# Patient Record
Sex: Female | Born: 1980 | Race: Black or African American | Hispanic: No | Marital: Single | Smoking: Never smoker
Health system: Southern US, Community
[De-identification: ages and names within clinical notes are randomized; demographics above are authoritative.]

## PROBLEM LIST (undated history)

## (undated) DIAGNOSIS — Z8619 Personal history of other infectious and parasitic diseases: Secondary | ICD-10-CM

## (undated) DIAGNOSIS — Z862 Personal history of diseases of the blood and blood-forming organs and certain disorders involving the immune mechanism: Secondary | ICD-10-CM

## (undated) DIAGNOSIS — D649 Anemia, unspecified: Secondary | ICD-10-CM

## (undated) DIAGNOSIS — N938 Other specified abnormal uterine and vaginal bleeding: Secondary | ICD-10-CM

## (undated) DIAGNOSIS — Z973 Presence of spectacles and contact lenses: Secondary | ICD-10-CM

## (undated) DIAGNOSIS — N840 Polyp of corpus uteri: Secondary | ICD-10-CM

## (undated) DIAGNOSIS — D259 Leiomyoma of uterus, unspecified: Secondary | ICD-10-CM

## (undated) DIAGNOSIS — R7303 Prediabetes: Secondary | ICD-10-CM

## (undated) DIAGNOSIS — A749 Chlamydial infection, unspecified: Secondary | ICD-10-CM

## (undated) DIAGNOSIS — D219 Benign neoplasm of connective and other soft tissue, unspecified: Secondary | ICD-10-CM

## (undated) HISTORY — PX: DILATION AND CURETTAGE OF UTERUS: SHX78

---
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
## 2001-02-17 ENCOUNTER — Emergency Department (HOSPITAL_COMMUNITY): Admission: EM | Admit: 2001-02-17 | Discharge: 2001-02-17 | Payer: Self-pay

## 2001-07-17 ENCOUNTER — Other Ambulatory Visit: Admission: RE | Admit: 2001-07-17 | Discharge: 2001-07-17 | Payer: Self-pay | Admitting: Obstetrics and Gynecology

## 2005-06-27 ENCOUNTER — Emergency Department (HOSPITAL_COMMUNITY): Admission: EM | Admit: 2005-06-27 | Discharge: 2005-06-27 | Payer: Self-pay | Admitting: Family Medicine

## 2005-06-28 ENCOUNTER — Ambulatory Visit (HOSPITAL_COMMUNITY): Admission: RE | Admit: 2005-06-28 | Discharge: 2005-06-28 | Payer: Self-pay | Admitting: Family Medicine

## 2005-11-21 ENCOUNTER — Other Ambulatory Visit: Admission: RE | Admit: 2005-11-21 | Discharge: 2005-11-21 | Payer: Self-pay | Admitting: Gynecology

## 2005-12-31 HISTORY — PX: DILATION AND CURETTAGE OF UTERUS: SHX78

## 2006-07-18 ENCOUNTER — Ambulatory Visit (HOSPITAL_BASED_OUTPATIENT_CLINIC_OR_DEPARTMENT_OTHER): Admission: RE | Admit: 2006-07-18 | Discharge: 2006-07-18 | Payer: Self-pay | Admitting: Gynecology

## 2006-07-18 ENCOUNTER — Encounter (INDEPENDENT_AMBULATORY_CARE_PROVIDER_SITE_OTHER): Payer: Self-pay | Admitting: *Deleted

## 2006-11-18 ENCOUNTER — Other Ambulatory Visit: Admission: RE | Admit: 2006-11-18 | Discharge: 2006-11-18 | Payer: Self-pay | Admitting: Gynecology

## 2007-07-24 ENCOUNTER — Other Ambulatory Visit: Admission: RE | Admit: 2007-07-24 | Discharge: 2007-07-24 | Payer: Self-pay | Admitting: Gynecology

## 2009-04-09 ENCOUNTER — Emergency Department (HOSPITAL_COMMUNITY): Admission: EM | Admit: 2009-04-09 | Discharge: 2009-04-09 | Payer: Self-pay | Admitting: Emergency Medicine

## 2010-11-26 ENCOUNTER — Emergency Department (HOSPITAL_COMMUNITY)
Admission: EM | Admit: 2010-11-26 | Discharge: 2010-11-26 | Payer: Self-pay | Source: Home / Self Care | Admitting: Family Medicine

## 2011-01-02 ENCOUNTER — Ambulatory Visit (HOSPITAL_COMMUNITY)
Admission: RE | Admit: 2011-01-02 | Discharge: 2011-01-02 | Payer: Self-pay | Source: Home / Self Care | Attending: Psychiatry | Admitting: Psychiatry

## 2011-01-26 ENCOUNTER — Emergency Department (HOSPITAL_COMMUNITY)
Admission: EM | Admit: 2011-01-26 | Discharge: 2011-01-26 | Payer: Self-pay | Source: Home / Self Care | Admitting: Family Medicine

## 2011-01-26 LAB — POCT PREGNANCY, URINE: Preg Test, Ur: NEGATIVE

## 2011-05-18 NOTE — Op Note (Signed)
Barbara Oconnell, Barbara Oconnell           ACCOUNT NO.:  [id number]   MEDICAL RECORD NO.:  [id number]          PATIENT TYPE:  AMB   LOCATION:  [facility]                         FACILITY:  [facility]   PHYSICIAN:  Gretta Cool, M.D. DATE OF BIRTH:  November 23, 1980   DATE OF PROCEDURE:  07/18/2006  DATE OF DISCHARGE:                                 OPERATIVE REPORT   PREOPERATIVE DIAGNOSIS:  Nonviable intrauterine pregnancy, 9 weeks 4 days.   POSTOPERATIVE DIAGNOSIS:  Nonviable intrauterine pregnancy, 9 weeks 4 days.   PROCEDURE:  Suction D&C.   ANESTHESIA:  General orotracheal.   DESCRIPTION OF PROCEDURE:  Under excellent anesthesia as above with the  patient prepped and draped in lithotomy position a speculum was placed in  the vagina and the cervix grasped with a single-tooth tenaculum and pulled  down into view.  The cervix was then progressively dilated with a series of  dilators to #35 Shawnie Pons.  At this point a 10-mm suction curette was introduced  and the entire endometrial cavity evacuated by suction curettage.  Once no  further tissue was produced the cavity was examined by sharp curette and  there was no further tissue and no irregular areas.  At this point the  procedure was terminated without complication and the patient returned to  the recovery room in excellent condition.   ADDENDUM:  Her blood type is A positive.           ______________________________  Gretta Cool, M.D.     CWL/MEDQ  D:  07/18/2006  T:  07/18/2006  Job:  [id number]

## 2012-01-01 NOTE — L&D Delivery Note (Signed)
Delivery Note At 5:35 PM a viable female was delivered via Vaginal, Spontaneous Delivery (Presentation: Left Occiput Anterior).  APGAR: 8, 9; weight Pending.   Placenta status: Intact, Spontaneous.  Cord: 3 vessels with the following complications: Short.  Cord pH: NA  Anesthesia: Local Epidural  Episiotomy: None Lacerations: Perineal;2nd degree Suture Repair: 3.0 vicryl vicryl rapide Est. Blood Loss (mL): 400  Mom to postpartum.  Baby to nursery-stable. Placenta to: BS Feeding: Breast Circ: NA Contraception: undecided.  Delivery performed by Dr. Casper Harrison with Dorathy Kinsman, CNM observing. Repair was performed by Dr. Casper Harrison and Dorathy Kinsman, CNM  [place], [place] 12/18/2012, 6:59 PM

## 2012-05-28 ENCOUNTER — Other Ambulatory Visit (HOSPITAL_COMMUNITY): Payer: Self-pay | Admitting: Physician Assistant

## 2012-05-28 ENCOUNTER — Other Ambulatory Visit: Payer: Self-pay | Admitting: Nurse Practitioner

## 2012-05-28 DIAGNOSIS — O3680X0 Pregnancy with inconclusive fetal viability, not applicable or unspecified: Secondary | ICD-10-CM

## 2012-05-28 LAB — CULTURE, OB URINE: Urine Culture, OB: NEGATIVE

## 2012-05-28 LAB — OB RESULTS CONSOLE VARICELLA ZOSTER ANTIBODY, IGG: Varicella: IMMUNE

## 2012-05-28 LAB — OB RESULTS CONSOLE RUBELLA ANTIBODY, IGM: Rubella: IMMUNE

## 2012-05-28 LAB — CYTOLOGY - PAP: CYTOLOGY - PAP: NORMAL

## 2012-05-28 LAB — OB RESULTS CONSOLE ABO/RH: RH Type: POSITIVE

## 2012-05-28 LAB — OB RESULTS CONSOLE GC/CHLAMYDIA
Chlamydia: POSITIVE
Gonorrhea: NEGATIVE

## 2012-05-28 LAB — CYSTIC FIBROSIS DIAGNOSTIC STUDY: Interpretation-CFDNA:: NEGATIVE

## 2012-05-28 LAB — OB RESULTS CONSOLE HGB/HCT, BLOOD: HCT: 30 %

## 2012-05-30 ENCOUNTER — Ambulatory Visit (HOSPITAL_COMMUNITY)
Admission: RE | Admit: 2012-05-30 | Discharge: 2012-05-30 | Disposition: A | Discharge status: Home / Self Care | Payer: Medicaid Other | Source: Ambulatory Visit | Attending: Physician Assistant | Admitting: Physician Assistant

## 2012-05-30 ENCOUNTER — Other Ambulatory Visit: Payer: Self-pay | Admitting: Obstetrics & Gynecology

## 2012-05-30 ENCOUNTER — Other Ambulatory Visit (HOSPITAL_COMMUNITY): Payer: Self-pay | Admitting: Physician Assistant

## 2012-05-30 DIAGNOSIS — Z3689 Encounter for other specified antenatal screening: Secondary | ICD-10-CM | POA: Insufficient documentation

## 2012-05-30 DIAGNOSIS — Z3682 Encounter for antenatal screening for nuchal translucency: Secondary | ICD-10-CM

## 2012-05-30 DIAGNOSIS — O3680X0 Pregnancy with inconclusive fetal viability, not applicable or unspecified: Secondary | ICD-10-CM | POA: Insufficient documentation

## 2012-05-30 DIAGNOSIS — O3410 Maternal care for benign tumor of corpus uteri, unspecified trimester: Secondary | ICD-10-CM | POA: Insufficient documentation

## 2012-06-23 ENCOUNTER — Ambulatory Visit (HOSPITAL_COMMUNITY)
Admission: RE | Admit: 2012-06-23 | Discharge: 2012-06-23 | Disposition: A | Discharge status: Home / Self Care | Payer: Medicaid Other | Source: Ambulatory Visit | Attending: Physician Assistant | Admitting: Physician Assistant

## 2012-06-23 ENCOUNTER — Encounter (HOSPITAL_COMMUNITY): Payer: Self-pay

## 2012-06-23 ENCOUNTER — Other Ambulatory Visit: Payer: Self-pay

## 2012-06-23 DIAGNOSIS — O3510X0 Maternal care for (suspected) chromosomal abnormality in fetus, unspecified, not applicable or unspecified: Secondary | ICD-10-CM | POA: Insufficient documentation

## 2012-06-23 DIAGNOSIS — O351XX0 Maternal care for (suspected) chromosomal abnormality in fetus, not applicable or unspecified: Secondary | ICD-10-CM | POA: Insufficient documentation

## 2012-06-23 DIAGNOSIS — O3410 Maternal care for benign tumor of corpus uteri, unspecified trimester: Secondary | ICD-10-CM | POA: Insufficient documentation

## 2012-06-23 DIAGNOSIS — Z3682 Encounter for antenatal screening for nuchal translucency: Secondary | ICD-10-CM

## 2012-06-23 DIAGNOSIS — Z3689 Encounter for other specified antenatal screening: Secondary | ICD-10-CM | POA: Insufficient documentation

## 2012-06-23 HISTORY — DX: Chlamydial infection, unspecified: A74.9

## 2012-06-23 HISTORY — DX: Benign neoplasm of connective and other soft tissue, unspecified: D21.9

## 2012-06-23 HISTORY — DX: Anemia, unspecified: D64.9

## 2012-06-25 LAB — OB RESULTS CONSOLE GC/CHLAMYDIA
Chlamydia: NEGATIVE
Gonorrhea: NEGATIVE

## 2012-06-27 ENCOUNTER — Telehealth (HOSPITAL_COMMUNITY): Payer: Self-pay | Admitting: MS"

## 2012-06-27 NOTE — Telephone Encounter (Signed)
Called Ms. Tishara Pizano to discuss result of her first trimester screen. Reviewed that this is screen positive for Down syndrome indicating a 1 in 249 (0.4%) risk. Patient stated that she felt this was still a low number and that she is not very worried about the chance for Down syndrome in the pregnancy. She stated that she nor the father of the baby have a history of birth defects, so she is not very worried about that aspect. Discussed that Down syndrome often does not run in families. Trisomy 18/13 risk is less than 1 in 10,000.    Discussed the option of genetic counseling visit to review additional testing options. Patient was unsure if she wanted this appointment at this time. Briefly discussed the testing options including amniocentesis in the second trimester and the associated 1 in 300 risk for complications, as well as noninvasive prenatal testing (NIPT), and the >99% detection rate for Down syndrome. Patient understands that amniocentesis would be more accurate, but is unsure about pursuing this testing given that she was already told she was high risk due to uterine fibroids. Also discussed that detailed ultrasound at approximately 18 weeks is a screening tool for Down syndrome but is not diagnostic and that this ultrasound would likely be scheduled in [facility]. Patient would have the option to have genetic counseling at that same visit, if she does not desire genetic counseling prior to that time. Reviewed various reasons why people may or may not want to know about a Down syndrome diagnosis during a pregnancy. Patient stated that she would want to have the information but wanted to think more about the testing options, and asked if I could mail her additional information.   Encouraged patient to call if she would like to set up genetic counseling visit prior to the time of her detailed ultrasound.   Clydie Braun Shawnae Leiva 06/27/2012 1:28 PM

## 2012-06-30 ENCOUNTER — Encounter (HOSPITAL_COMMUNITY): Payer: Self-pay | Admitting: Physician Assistant

## 2012-06-30 ENCOUNTER — Other Ambulatory Visit (HOSPITAL_COMMUNITY): Payer: Self-pay | Admitting: Physician Assistant

## 2012-06-30 DIAGNOSIS — O3410 Maternal care for benign tumor of corpus uteri, unspecified trimester: Secondary | ICD-10-CM

## 2012-06-30 DIAGNOSIS — Z3689 Encounter for other specified antenatal screening: Secondary | ICD-10-CM

## 2012-07-01 DIAGNOSIS — O0990 Supervision of high risk pregnancy, unspecified, unspecified trimester: Secondary | ICD-10-CM

## 2012-07-01 DIAGNOSIS — D259 Leiomyoma of uterus, unspecified: Secondary | ICD-10-CM

## 2012-07-01 DIAGNOSIS — O09899 Supervision of other high risk pregnancies, unspecified trimester: Secondary | ICD-10-CM | POA: Insufficient documentation

## 2012-07-02 DIAGNOSIS — D259 Leiomyoma of uterus, unspecified: Secondary | ICD-10-CM | POA: Insufficient documentation

## 2012-07-02 DIAGNOSIS — O3410 Maternal care for benign tumor of corpus uteri, unspecified trimester: Secondary | ICD-10-CM | POA: Insufficient documentation

## 2012-07-07 ENCOUNTER — Encounter: Payer: Self-pay | Admitting: Obstetrics & Gynecology

## 2012-07-07 ENCOUNTER — Ambulatory Visit (INDEPENDENT_AMBULATORY_CARE_PROVIDER_SITE_OTHER): Payer: Medicaid Other | Admitting: Obstetrics & Gynecology

## 2012-07-07 VITALS — BP 110/70 | Temp 98.8°F | Wt 207.8 lb

## 2012-07-07 DIAGNOSIS — O358XX0 Maternal care for other (suspected) fetal abnormality and damage, not applicable or unspecified: Secondary | ICD-10-CM

## 2012-07-07 DIAGNOSIS — Z803 Family history of malignant neoplasm of breast: Secondary | ICD-10-CM

## 2012-07-07 DIAGNOSIS — O359XX0 Maternal care for (suspected) fetal abnormality and damage, unspecified, not applicable or unspecified: Secondary | ICD-10-CM

## 2012-07-07 LAB — POCT URINALYSIS DIP (DEVICE)
Bilirubin Urine: NEGATIVE
Leukocytes, UA: NEGATIVE
Nitrite: NEGATIVE
pH: 5 (ref 5.0–8.0)

## 2012-07-07 NOTE — Progress Notes (Signed)
Pt wants Harmony testing for increased risk of down's syndrome.  Pt does not want to meet with genetic counseling (spoke on phone with them for 20 mins)  No problems.  Pt needs AFP next visit.  Just notified by RN that pt does not have insurance so she elects not to get the test.

## 2012-07-07 NOTE — Progress Notes (Signed)
LBP x a couple days

## 2012-07-07 NOTE — Progress Notes (Signed)
Patient has an U/S already scheduled with MFM on Tuesday, August 05, 2012 at 10 am and genetic counseling at 13 am. Patient states she may cancel the genetic counseling appt.

## 2012-07-21 ENCOUNTER — Ambulatory Visit (INDEPENDENT_AMBULATORY_CARE_PROVIDER_SITE_OTHER): Payer: Medicaid Other | Admitting: Obstetrics & Gynecology

## 2012-07-21 VITALS — BP 100/60 | Temp 98.7°F | Wt 202.7 lb

## 2012-07-21 DIAGNOSIS — D259 Leiomyoma of uterus, unspecified: Secondary | ICD-10-CM

## 2012-07-21 DIAGNOSIS — O3410 Maternal care for benign tumor of corpus uteri, unspecified trimester: Secondary | ICD-10-CM

## 2012-07-21 DIAGNOSIS — O0990 Supervision of high risk pregnancy, unspecified, unspecified trimester: Secondary | ICD-10-CM

## 2012-07-21 LAB — POCT URINALYSIS DIP (DEVICE)
Ketones, ur: 15 mg/dL — AB
Leukocytes, UA: NEGATIVE
Protein, ur: NEGATIVE mg/dL
Urobilinogen, UA: 0.2 mg/dL (ref 0.0–1.0)

## 2012-07-21 NOTE — Progress Notes (Signed)
Did Harmony test, awaiting results. AFP drawn today.  No other complaints or concerns.  Pain and labor precautions reviewed.

## 2012-07-21 NOTE — Patient Instructions (Signed)
Return to clinic for any obstetric concerns or go to [facility] for evaluation  

## 2012-07-21 NOTE — Progress Notes (Signed)
P= 120 Improved pressure, still some in lower pelvis

## 2012-08-04 ENCOUNTER — Encounter (HOSPITAL_COMMUNITY): Payer: Self-pay

## 2012-08-04 ENCOUNTER — Encounter: Payer: Self-pay | Admitting: Physician Assistant

## 2012-08-04 ENCOUNTER — Ambulatory Visit (HOSPITAL_COMMUNITY)
Admission: RE | Admit: 2012-08-04 | Discharge: 2012-08-04 | Disposition: A | Discharge status: Home / Self Care | Payer: Medicaid Other | Source: Ambulatory Visit | Attending: Obstetrics & Gynecology | Admitting: Obstetrics & Gynecology

## 2012-08-04 ENCOUNTER — Encounter: Payer: Self-pay | Admitting: Obstetrics & Gynecology

## 2012-08-04 ENCOUNTER — Other Ambulatory Visit (HOSPITAL_COMMUNITY): Payer: Self-pay

## 2012-08-04 ENCOUNTER — Other Ambulatory Visit: Payer: Self-pay

## 2012-08-04 VITALS — BP 118/68 | HR 100 | Wt 205.0 lb

## 2012-08-04 DIAGNOSIS — O0990 Supervision of high risk pregnancy, unspecified, unspecified trimester: Secondary | ICD-10-CM

## 2012-08-04 DIAGNOSIS — Z1389 Encounter for screening for other disorder: Secondary | ICD-10-CM | POA: Insufficient documentation

## 2012-08-04 DIAGNOSIS — Z363 Encounter for antenatal screening for malformations: Secondary | ICD-10-CM | POA: Insufficient documentation

## 2012-08-04 DIAGNOSIS — O289 Unspecified abnormal findings on antenatal screening of mother: Secondary | ICD-10-CM | POA: Insufficient documentation

## 2012-08-04 DIAGNOSIS — O358XX0 Maternal care for other (suspected) fetal abnormality and damage, not applicable or unspecified: Secondary | ICD-10-CM | POA: Insufficient documentation

## 2012-08-04 DIAGNOSIS — O359XX0 Maternal care for (suspected) fetal abnormality and damage, unspecified, not applicable or unspecified: Secondary | ICD-10-CM

## 2012-08-04 DIAGNOSIS — O3410 Maternal care for benign tumor of corpus uteri, unspecified trimester: Secondary | ICD-10-CM

## 2012-08-04 DIAGNOSIS — D259 Leiomyoma of uterus, unspecified: Secondary | ICD-10-CM

## 2012-08-04 NOTE — Progress Notes (Signed)
Patient seen today  for ultrasound.  See full report in [facility].  Alpha Gula, MD  Single IUP at 18 0/7 weeks  Echogenic intracardiac focus; no other soft markers for aneuploidy noted Detailed fetal anatomy otherwise within normal limits Normal amniotic fluid volume Multiple uterine fibroids noted as above  After counseling, the patient elected to have cell free fetal DNA testing repeated (no result from initial test)  Recommend follow up ultrasounds as clinically indicated

## 2012-08-05 ENCOUNTER — Other Ambulatory Visit (HOSPITAL_COMMUNITY): Payer: Medicaid Other

## 2012-08-05 ENCOUNTER — Encounter (HOSPITAL_COMMUNITY): Payer: Medicaid Other

## 2012-08-14 ENCOUNTER — Telehealth (HOSPITAL_COMMUNITY): Payer: Self-pay | Admitting: MS"

## 2012-08-14 NOTE — Telephone Encounter (Signed)
Attempted to contact patient regarding results of cell free fetal DNA testing (Harmony) performed. Receive automated message at the number listed for patient that stated, "The person you are calling is not accepting calls at this time."  Other number listed in patient's chart is for her significant other (emergency contact). Also unable to leave message this number. Received same message that, "The person you are calling is not accepting calls at this time."   Clydie Braun Corneliussen8/15/2013 4:20 PM

## 2012-08-18 ENCOUNTER — Telehealth (HOSPITAL_COMMUNITY): Payer: Self-pay | Admitting: MS"

## 2012-08-18 ENCOUNTER — Encounter: Payer: Medicaid Other | Admitting: Family Medicine

## 2012-08-18 NOTE — Telephone Encounter (Signed)
Called Deshia Vanderhoof to discuss her Harmony, cell free fetal DNA testing. Testing was offered because of increased risk for Down syndrome from first trimester screening. We reviewed that these are within normal limits, showing a less than 1 in 10,000 risk for trisomies 21, 18 and 13. We reviewed that this testing identifies > 99% of pregnancies with trisomy 21, >98% of pregnancies with trisomy 45, and >80% with trisomy 46; the false positive rate is <0.1% for all conditions. Testing was also performed for X and Y chromosome analysis. This did not show evidence of aneuploidy for X or Y. It was also consistent with female gender (XX). X and Y analysis has a detection rate of approximately 99%. She understands that this testing does not identify all genetic conditions. All questions were answered to her satisfaction, she was encouraged to call with additional questions or concerns.  Quinn Plowman, MS Patent attorney

## 2012-08-25 ENCOUNTER — Ambulatory Visit (INDEPENDENT_AMBULATORY_CARE_PROVIDER_SITE_OTHER): Payer: Medicaid Other | Admitting: Family Medicine

## 2012-08-25 VITALS — BP 127/72 | Temp 97.7°F | Ht 61.0 in | Wt 204.5 lb

## 2012-08-25 DIAGNOSIS — D259 Leiomyoma of uterus, unspecified: Secondary | ICD-10-CM

## 2012-08-25 DIAGNOSIS — O0990 Supervision of high risk pregnancy, unspecified, unspecified trimester: Secondary | ICD-10-CM

## 2012-08-25 DIAGNOSIS — O3410 Maternal care for benign tumor of corpus uteri, unspecified trimester: Secondary | ICD-10-CM

## 2012-08-25 LAB — POCT URINALYSIS DIP (DEVICE)
Glucose, UA: NEGATIVE mg/dL
Hgb urine dipstick: NEGATIVE
Ketones, ur: NEGATIVE mg/dL
Specific Gravity, Urine: 1.025 (ref 1.005–1.030)

## 2012-08-25 NOTE — Progress Notes (Signed)
Pulse- 92   Pt c/o itching at ankles just later in day

## 2012-08-25 NOTE — Patient Instructions (Signed)
Pregnancy - Second Trimester The second trimester of pregnancy (3 to 6 months) is a period of rapid growth for you and your baby. At the end of the sixth month, your baby is about 9 inches long and weighs 1 1/2 pounds. You will begin to feel the baby move between 18 and 20 weeks of the pregnancy. This is called quickening. Weight gain is faster. A clear fluid (colostrum) may leak out of your breasts. You may feel small contractions of the womb (uterus). This is known as false labor or Braxton-Hicks contractions. This is like a practice for labor when the baby is ready to be born. Usually, the problems with morning sickness have usually passed by the end of your first trimester. Some women develop small dark blotches (called cholasma, mask of pregnancy) on their face that usually goes away after the baby is born. Exposure to the sun makes the blotches worse. Acne may also develop in some pregnant women and pregnant women who have acne, may find that it goes away. PRENATAL EXAMS  Blood work may continue to be done during prenatal exams. These tests are done to check on your health and the probable health of your baby. Blood work is used to follow your blood levels (hemoglobin). Anemia (low hemoglobin) is common during pregnancy. Iron and vitamins are given to help prevent this. You will also be checked for diabetes between 24 and 28 weeks of the pregnancy. Some of the previous blood tests may be repeated.   The size of the uterus is measured during each visit. This is to make sure that the baby is continuing to grow properly according to the dates of the pregnancy.   Your blood pressure is checked every prenatal visit. This is to make sure you are not getting toxemia.   Your urine is checked to make sure you do not have an infection, diabetes or protein in the urine.   Your weight is checked often to make sure gains are happening at the suggested rate. This is to ensure that both you and your baby are  growing normally.   Sometimes, an ultrasound is performed to confirm the proper growth and development of the baby. This is a test which bounces harmless sound waves off the baby so your caregiver can more accurately determine due dates.  Sometimes, a specialized test is done on the amniotic fluid surrounding the baby. This test is called an amniocentesis. The amniotic fluid is obtained by sticking a needle into the belly (abdomen). This is done to check the chromosomes in instances where there is a concern about possible genetic problems with the baby. It is also sometimes done near the end of pregnancy if an early delivery is required. In this case, it is done to help make sure the baby's lungs are mature enough for the baby to live outside of the womb. CHANGES OCCURING IN THE SECOND TRIMESTER OF PREGNANCY Your body goes through many changes during pregnancy. They vary from person to person. Talk to your caregiver about changes you notice that you are concerned about.  During the second trimester, you will likely have an increase in your appetite. It is normal to have cravings for certain foods. This varies from person to person and pregnancy to pregnancy.   Your lower abdomen will begin to bulge.   You may have to urinate more often because the uterus and baby are pressing on your bladder. It is also common to get more bladder infections during pregnancy (  pain with urination). You can help this by drinking lots of fluids and emptying your bladder before and after intercourse.   You may begin to get stretch marks on your hips, abdomen, and breasts. These are normal changes in the body during pregnancy. There are no exercises or medications to take that prevent this change.   You may begin to develop swollen and bulging veins (varicose veins) in your legs. Wearing support hose, elevating your feet for 15 minutes, 3 to 4 times a day and limiting salt in your diet helps lessen the problem.    Heartburn may develop as the uterus grows and pushes up against the stomach. Antacids recommended by your caregiver helps with this problem. Also, eating smaller meals 4 to 5 times a day helps.   Constipation can be treated with a stool softener or adding bulk to your diet. Drinking lots of fluids, vegetables, fruits, and whole grains are helpful.   Exercising is also helpful. If you have been very active up until your pregnancy, most of these activities can be continued during your pregnancy. If you have been less active, it is helpful to start an exercise program such as walking.   Hemorrhoids (varicose veins in the rectum) may develop at the end of the second trimester. Warm sitz baths and hemorrhoid cream recommended by your caregiver helps hemorrhoid problems.   Backaches may develop during this time of your pregnancy. Avoid heavy lifting, wear low heal shoes and practice good posture to help with backache problems.   Some pregnant women develop tingling and numbness of their hand and fingers because of swelling and tightening of ligaments in the wrist (carpel tunnel syndrome). This goes away after the baby is born.   As your breasts enlarge, you may have to get a bigger bra. Get a comfortable, cotton, support bra. Do not get a nursing bra until the last month of the pregnancy if you will be nursing the baby.   You may get a dark line from your belly button to the pubic area called the linea nigra.   You may develop rosy cheeks because of increase blood flow to the face.   You may develop spider looking lines of the face, neck, arms and chest. These go away after the baby is born.  HOME CARE INSTRUCTIONS   It is extremely important to avoid all smoking, herbs, alcohol, and unprescribed drugs during your pregnancy. These chemicals affect the formation and growth of the baby. Avoid these chemicals throughout the pregnancy to ensure the delivery of a healthy infant.   Most of your home  care instructions are the same as suggested for the first trimester of your pregnancy. Keep your caregiver's appointments. Follow your caregiver's instructions regarding medication use, exercise and diet.   During pregnancy, you are providing food for you and your baby. Continue to eat regular, well-balanced meals. Choose foods such as meat, fish, milk and other low fat dairy products, vegetables, fruits, and whole-grain breads and cereals. Your caregiver will tell you of the ideal weight gain.   A physical sexual relationship may be continued up until near the end of pregnancy if there are no other problems. Problems could include early (premature) leaking of amniotic fluid from the membranes, vaginal bleeding, abdominal pain, or other medical or pregnancy problems.   Exercise regularly if there are no restrictions. Check with your caregiver if you are unsure of the safety of some of your exercises. The greatest weight gain will occur in the   last 2 trimesters of pregnancy. Exercise will help you:   Control your weight.   Get you in shape for labor and delivery.   Lose weight after you have the baby.   Wear a good support or jogging bra for breast tenderness during pregnancy. This may help if worn during sleep. Pads or tissues may be used in the bra if you are leaking colostrum.   Do not use hot tubs, steam rooms or saunas throughout the pregnancy.   Wear your seat belt at all times when driving. This protects you and your baby if you are in an accident.   Avoid raw meat, uncooked cheese, cat litter boxes and soil used by cats. These carry germs that can cause birth defects in the baby.   The second trimester is also a good time to visit your dentist for your dental health if this has not been done yet. Getting your teeth cleaned is OK. Use a soft toothbrush. Brush gently during pregnancy.   It is easier to loose urine during pregnancy. Tightening up and strengthening the pelvic muscles will  help with this problem. Practice stopping your urination while you are going to the bathroom. These are the same muscles you need to strengthen. It is also the muscles you would use as if you were trying to stop from passing gas. You can practice tightening these muscles up 10 times a set and repeating this about 3 times per day. Once you know what muscles to tighten up, do not perform these exercises during urination. It is more likely to contribute to an infection by backing up the urine.   Ask for help if you have financial, counseling or nutritional needs during pregnancy. Your caregiver will be able to offer counseling for these needs as well as refer you for other special needs.   Your skin may become oily. If so, wash your face with mild soap, use non-greasy moisturizer and oil or cream based makeup.  MEDICATIONS AND DRUG USE IN PREGNANCY  Take prenatal vitamins as directed. The vitamin should contain 1 milligram of folic acid. Keep all vitamins out of reach of children. Only a couple vitamins or tablets containing iron may be fatal to a baby or young child when ingested.   Avoid use of all medications, including herbs, over-the-counter medications, not prescribed or suggested by your caregiver. Only take over-the-counter or prescription medicines for pain, discomfort, or fever as directed by your caregiver. Do not use aspirin.   Let your caregiver also know about herbs you may be using.   Alcohol is related to a number of birth defects. This includes fetal alcohol syndrome. All alcohol, in any form, should be avoided completely. Smoking will cause low birth rate and premature babies.   Street or illegal drugs are very harmful to the baby. They are absolutely forbidden. A baby born to an addicted mother will be addicted at birth. The baby will go through the same withdrawal an adult does.  SEEK MEDICAL CARE IF:  You have any concerns or worries during your pregnancy. It is better to call with  your questions if you feel they cannot wait, rather than worry about them. SEEK IMMEDIATE MEDICAL CARE IF:   An unexplained oral temperature above 102 F (38.9 C) develops, or as your caregiver suggests.   You have leaking of fluid from the vagina (birth canal). If leaking membranes are suspected, take your temperature and tell your caregiver of this when you call.   There   is vaginal spotting, bleeding, or passing clots. Tell your caregiver of the amount and how many pads are used. Light spotting in pregnancy is common, especially following intercourse.   You develop a bad smelling vaginal discharge with a change in the color from clear to white.   You continue to feel sick to your stomach (nauseated) and have no relief from remedies suggested. You vomit blood or coffee ground-like materials.   You lose more than 2 pounds of weight or gain more than 2 pounds of weight over 1 week, or as suggested by your caregiver.   You notice swelling of your face, hands, feet, or legs.   You get exposed to [place] measles and have never had them.   You are exposed to fifth disease or chickenpox.   You develop belly (abdominal) pain. Round ligament discomfort is a common non-cancerous (benign) cause of abdominal pain in pregnancy. Your caregiver still must evaluate you.   You develop a bad headache that does not go away.   You develop fever, diarrhea, pain with urination, or shortness of breath.   You develop visual problems, blurry, or double vision.   You fall or are in a car accident or any kind of trauma.   There is mental or physical violence at home.  Document Released: 12/11/2001 Document Revised: 12/06/2011 Document Reviewed: 06/15/2009 [facility] Patient Information 2012 [place], [place]. Contraception Choices Contraception (birth control) is the use of any methods or devices to prevent pregnancy. Below are some methods to help avoid pregnancy. HORMONAL METHODS   Contraceptive implant.  This is a thin, plastic tube containing progesterone hormone. It does not contain estrogen hormone. Your caregiver inserts the tube in the inner part of the upper arm. The tube can remain in place for up to 3 years. After 3 years, the implant must be removed. The implant prevents the ovaries from releasing an egg (ovulation), thickens the cervical mucus which prevents sperm from entering the uterus, and thins the lining of the inside of the uterus.   Progesterone-only injections. These injections are given every 3 months by your caregiver to prevent pregnancy. This synthetic progesterone hormone stops the ovaries from releasing eggs. It also thickens cervical mucus and changes the uterine lining. This makes it harder for sperm to survive in the uterus.   Birth control pills. These pills contain estrogen and progesterone hormone. They work by stopping the egg from forming in the ovary (ovulation). Birth control pills are prescribed by a caregiver.Birth control pills can also be used to treat heavy periods.   Minipill. This type of birth control pill contains only the progesterone hormone. They are taken every day of each month and must be prescribed by your caregiver.   Birth control patch. The patch contains hormones similar to those in birth control pills. It must be changed once a week and is prescribed by a caregiver.   Vaginal ring. The ring contains hormones similar to those in birth control pills. It is left in the vagina for 3 weeks, removed for 1 week, and then a new one is put back in place. The patient must be comfortable inserting and removing the ring from the vagina.A caregiver's prescription is necessary.   Emergency contraception. Emergency contraceptives prevent pregnancy after unprotected sexual intercourse. This pill can be taken right after sex or up to 5 days after unprotected sex. It is most effective the sooner you take the pills after having sexual intercourse. Emergency  contraceptive pills are available without a prescription.  Check with your pharmacist. Do not use emergency contraception as your only form of birth control.  BARRIER METHODS   Female condom. This is a thin sheath (latex or rubber) that is worn over the penis during sexual intercourse. It can be used with spermicide to increase effectiveness.   Female condom. This is a soft, loose-fitting sheath that is put into the vagina before sexual intercourse.   Diaphragm. This is a soft, latex, dome-shaped barrier that must be fitted by a caregiver. It is inserted into the vagina, along with a spermicidal jelly. It is inserted before intercourse. The diaphragm should be left in the vagina for 6 to 8 hours after intercourse.   Cervical cap. This is a round, soft, latex or plastic cup that fits over the cervix and must be fitted by a caregiver. The cap can be left in place for up to 48 hours after intercourse.   Sponge. This is a soft, circular piece of polyurethane foam. The sponge has spermicide in it. It is inserted into the vagina after wetting it and before sexual intercourse.   Spermicides. These are chemicals that kill or block sperm from entering the cervix and uterus. They come in the form of creams, jellies, suppositories, foam, or tablets. They do not require a prescription. They are inserted into the vagina with an applicator before having sexual intercourse. The process must be repeated every time you have sexual intercourse.  INTRAUTERINE CONTRACEPTION  Intrauterine device (IUD). This is a T-shaped device that is put in a woman's uterus during a menstrual period to prevent pregnancy. There are 2 types:   Copper IUD. This type of IUD is wrapped in copper wire and is placed inside the uterus. Copper makes the uterus and fallopian tubes produce a fluid that kills sperm. It can stay in place for 10 years.   Hormone IUD. This type of IUD contains the hormone progestin (synthetic progesterone). The  hormone thickens the cervical mucus and prevents sperm from entering the uterus, and it also thins the uterine lining to prevent implantation of a fertilized egg. The hormone can weaken or kill the sperm that get into the uterus. It can stay in place for 5 years.  PERMANENT METHODS OF CONTRACEPTION  Female tubal ligation. This is when the woman's fallopian tubes are surgically sealed, tied, or blocked to prevent the egg from traveling to the uterus.   Female sterilization. This is when the female has the tubes that carry sperm tied off (vasectomy).This blocks sperm from entering the vagina during sexual intercourse. After the procedure, the man can still ejaculate fluid (semen).  NATURAL PLANNING METHODS  Natural family planning. This is not having sexual intercourse or using a barrier method (condom, diaphragm, cervical cap) on days the woman could become pregnant.   Calendar method. This is keeping track of the length of each menstrual cycle and identifying when you are fertile.   Ovulation method. This is avoiding sexual intercourse during ovulation.   Symptothermal method. This is avoiding sexual intercourse during ovulation, using a thermometer and ovulation symptoms.   Post-ovulation method. This is timing sexual intercourse after you have ovulated.  Regardless of which type or method of contraception you choose, it is important that you use condoms to protect against the transmission of sexually transmitted diseases (STDs). Talk with your caregiver about which form of contraception is most appropriate for you. Document Released: 12/17/2005 Document Revised: 12/06/2011 Document Reviewed: 04/25/2011 [facility] Patient Information 2012 [place], [place].

## 2012-08-25 NOTE — Progress Notes (Signed)
Doing well-no problems today.

## 2012-09-04 ENCOUNTER — Telehealth: Payer: Self-pay | Admitting: *Deleted

## 2012-09-04 NOTE — Telephone Encounter (Signed)
Pt left message inquiring as to when her next ultrasound will be scheduled.  She stated that a message may be left on her personal voice mail # [phone]. I called pt and left message that she does not have any additional ultrasounds ordered at this time.  They will be done as clinically indicated as her pregnancy progresses.  I verified that her next clinic appt is 09/15/12 @ 0745.

## 2012-09-15 ENCOUNTER — Ambulatory Visit (INDEPENDENT_AMBULATORY_CARE_PROVIDER_SITE_OTHER): Payer: Medicaid Other | Admitting: Obstetrics & Gynecology

## 2012-09-15 VITALS — BP 115/71 | Temp 97.1°F | Wt 200.5 lb

## 2012-09-15 DIAGNOSIS — O0990 Supervision of high risk pregnancy, unspecified, unspecified trimester: Secondary | ICD-10-CM

## 2012-09-15 DIAGNOSIS — O3410 Maternal care for benign tumor of corpus uteri, unspecified trimester: Secondary | ICD-10-CM

## 2012-09-15 DIAGNOSIS — D259 Leiomyoma of uterus, unspecified: Secondary | ICD-10-CM

## 2012-09-15 LAB — POCT URINALYSIS DIP (DEVICE)
Glucose, UA: NEGATIVE mg/dL
Ketones, ur: 40 mg/dL — AB
Specific Gravity, Urine: 1.02 (ref 1.005–1.030)
pH: 6 (ref 5.0–8.0)

## 2012-09-15 NOTE — Progress Notes (Signed)
P = 92  Pt reports small amount of brown vag d/c.

## 2012-09-15 NOTE — Patient Instructions (Addendum)
Fibroids Fibroids are lumps (tumors) that can occur any place in a woman's body. These lumps are not cancerous. Fibroids vary in size, weight, and where they grow. HOME CARE  Do not take aspirin.   Write down the number of pads or tampons you use during your period. Tell your doctor. This can help determine the best treatment for you.  GET HELP RIGHT AWAY IF:  You have pain in your lower belly (abdomen) that is not helped with medicine.   You have cramps that are not helped with medicine.   You have more bleeding between or during your period.   You feel lightheaded or pass out (faint).   Your lower belly pain gets worse.  MAKE SURE YOU:  Understand these instructions.   Will watch your condition.   Will get help right away if you are not doing well or get worse.  Document Released: 01/19/2011 Document Revised: 12/06/2011 Document Reviewed: 01/19/2011 [facility] Patient Information 2012 [place], [place].

## 2012-09-15 NOTE — Progress Notes (Signed)
Some brown mucus discharge last night. No blood No contractions or LOF. Mucus at os noted.

## 2012-09-15 NOTE — Progress Notes (Signed)
Nutrition Note: 1st consult Pt has h/o wt loss during this pregnancy. Pt has lost 7.5# @ [redacted]w[redacted]d.  Pt reports eating 3 meals & 3-4 snacks/d. Pt stated she drinks water, 3 c of juice, & 1 c of milk/ d. Pt stated she has cut down on her sweet/ junk food intake compared to before she was pregnant, which could be one reason for inadequate wt gain/ wt loss. Pt stated she is taking PNV. Pt stated she walks daily. Pt reports Nausea & heartburn occ but no vomiting.  Pt received verbal & written education on tips to gain more wt & encouraged pt to eat protein with all meals & snacks and eat more often. Also discussed tips to decrease heartburn. Discussed wt gain goals of 11-20# or 0.5#/wk.  Pt agrees to continue PNV & try to eat q 2-3hrs. Pt does receive WIC services. F/u in 2-4 wks Blondell Reveal, MS, RD, LDN

## 2012-09-24 ENCOUNTER — Telehealth: Payer: Self-pay | Admitting: Obstetrics and Gynecology

## 2012-09-24 NOTE — Telephone Encounter (Signed)
Patient called with c/o nagging on/off mild to moderate pain in pelvic. Relieved when ambulates. Advised patient that this is normal due to ligaments changing and also considering that she has fibroids. Informed of pre-term labor precautions and its signs and symptoms and to go to [facility] if she does get these symptoms. Patient agrees and satisfied.

## 2012-10-06 ENCOUNTER — Ambulatory Visit (INDEPENDENT_AMBULATORY_CARE_PROVIDER_SITE_OTHER): Payer: Medicaid Other | Admitting: Family Medicine

## 2012-10-06 VITALS — BP 107/72 | Temp 97.1°F | Wt 201.6 lb

## 2012-10-06 DIAGNOSIS — O0992 Supervision of high risk pregnancy, unspecified, second trimester: Secondary | ICD-10-CM

## 2012-10-06 DIAGNOSIS — D259 Leiomyoma of uterus, unspecified: Secondary | ICD-10-CM

## 2012-10-06 DIAGNOSIS — O3410 Maternal care for benign tumor of corpus uteri, unspecified trimester: Secondary | ICD-10-CM

## 2012-10-06 LAB — CBC
HCT: 33.9 % — ABNORMAL LOW (ref 36.0–46.0)
Hemoglobin: 11.2 g/dL — ABNORMAL LOW (ref 12.0–15.0)
MCH: 27.1 pg (ref 26.0–34.0)
MCV: 81.9 fL (ref 78.0–100.0)
RBC: 4.14 MIL/uL (ref 3.87–5.11)

## 2012-10-06 LAB — POCT URINALYSIS DIP (DEVICE)
Bilirubin Urine: NEGATIVE
Hgb urine dipstick: NEGATIVE
Ketones, ur: 15 mg/dL — AB
Specific Gravity, Urine: 1.025 (ref 1.005–1.030)
pH: 6 (ref 5.0–8.0)

## 2012-10-06 NOTE — Progress Notes (Signed)
No contractions/cramping, bleeding. Baby moving well. No headache or vision changes. 1 hour GTT today with 28 week labs. F/U 2 weeks. Abnl first trimester screen, [place] screen negative (low risk).

## 2012-10-06 NOTE — Progress Notes (Signed)
Pulse- 94  Edema- feet 

## 2012-10-06 NOTE — Patient Instructions (Signed)
Pregnancy - Third Trimester  The third trimester of pregnancy (the last 3 months) is a period of the most rapid growth for you and your baby. The baby approaches a length of 20 inches and a weight of 6 to 10 pounds. The baby is adding on fat and getting ready for life outside your body. While inside, babies have periods of sleeping and waking, suck their thumbs, and hiccups. You can often feel small contractions of the uterus. This is false labor. It is also called Braxton-Hicks contractions. This is like a practice for labor. The usual problems in this stage of pregnancy include more difficulty breathing, swelling of the hands and feet from water retention, and having to urinate more often because of the uterus and baby pressing on your bladder.   PRENATAL EXAMS  · Blood work may continue to be done during prenatal exams. These tests are done to check on your health and the probable health of your baby. Blood work is used to follow your blood levels (hemoglobin). Anemia (low hemoglobin) is common during pregnancy. Iron and vitamins are given to help prevent this. You may also continue to be checked for diabetes. Some of the past blood tests may be done again.  · The size of the uterus is measured during each visit. This makes sure your baby is growing properly according to your pregnancy dates.  · Your blood pressure is checked every prenatal visit. This is to make sure you are not getting toxemia.  · Your urine is checked every prenatal visit for infection, diabetes and protein.  · Your weight is checked at each visit. This is done to make sure gains are happening at the suggested rate and that you and your baby are growing normally.  · Sometimes, an ultrasound is performed to confirm the position and the proper growth and development of the baby. This is a test done that bounces harmless sound waves off the baby so your caregiver can more accurately determine due dates.  · Discuss the type of pain medication and  anesthesia you will have during your labor and delivery.  · Discuss the possibility and anesthesia if a Cesarean Section might be necessary.  · Inform your caregiver if there is any mental or physical violence at home.  Sometimes, a specialized non-stress test, contraction stress test and biophysical profile are done to make sure the baby is not having a problem. Checking the amniotic fluid surrounding the baby is called an amniocentesis. The amniotic fluid is removed by sticking a needle into the belly (abdomen). This is sometimes done near the end of pregnancy if an early delivery is required. In this case, it is done to help make sure the baby's lungs are mature enough for the baby to live outside of the womb. If the lungs are not mature and it is unsafe to deliver the baby, an injection of cortisone medication is given to the mother 1 to 2 days before the delivery. This helps the baby's lungs mature and makes it safer to deliver the baby.  CHANGES OCCURING IN THE THIRD TRIMESTER OF PREGNANCY  Your body goes through many changes during pregnancy. They vary from person to person. Talk to your caregiver about changes you notice and are concerned about.  · During the last trimester, you have probably had an increase in your appetite. It is normal to have cravings for certain foods. This varies from person to person and pregnancy to pregnancy.  · You may begin to   get stretch marks on your hips, abdomen, and breasts. These are normal changes in the body during pregnancy. There are no exercises or medications to take which prevent this change.  · Constipation may be treated with a stool softener or adding bulk to your diet. Drinking lots of fluids, fiber in vegetables, fruits, and whole grains are helpful.  · Exercising is also helpful. If you have been very active up until your pregnancy, most of these activities can be continued during your pregnancy. If you have been less active, it is helpful to start an exercise  program such as walking. Consult your caregiver before starting exercise programs.  · Avoid all smoking, alcohol, un-prescribed drugs, herbs and "street drugs" during your pregnancy. These chemicals affect the formation and growth of the baby. Avoid chemicals throughout the pregnancy to ensure the delivery of a healthy infant.  · Backache, varicose veins and hemorrhoids may develop or get worse.  · You will tire more easily in the third trimester, which is normal.  · The baby's movements may be stronger and more often.  · You may become short of breath easily.  · Your belly button may stick out.  · A yellow discharge may leak from your breasts called colostrum.  · You may have a bloody mucus discharge. This usually occurs a few days to a week before labor begins.  HOME CARE INSTRUCTIONS   · Keep your caregiver's appointments. Follow your caregiver's instructions regarding medication use, exercise, and diet.  · During pregnancy, you are providing food for you and your baby. Continue to eat regular, well-balanced meals. Choose foods such as meat, fish, milk and other low fat dairy products, vegetables, fruits, and whole-grain breads and cereals. Your caregiver will tell you of the ideal weight gain.  · A physical sexual relationship may be continued throughout pregnancy if there are no other problems such as early (premature) leaking of amniotic fluid from the membranes, vaginal bleeding, or belly (abdominal) pain.  · Exercise regularly if there are no restrictions. Check with your caregiver if you are unsure of the safety of your exercises. Greater weight gain will occur in the last 2 trimesters of pregnancy. Exercising helps:  · Control your weight.  · Get you in shape for labor and delivery.  · You lose weight after you deliver.  · Rest a lot with legs elevated, or as needed for leg cramps or low back pain.  · Wear a good support or jogging bra for breast tenderness during pregnancy. This may help if worn during  sleep. Pads or tissues may be used in the bra if you are leaking colostrum.  · Do not use hot tubs, steam rooms, or saunas.  · Wear your seat belt when driving. This protects you and your baby if you are in an accident.  · Avoid raw meat, cat litter boxes and soil used by cats. These carry germs that can cause birth defects in the baby.  · It is easier to loose urine during pregnancy. Tightening up and strengthening the pelvic muscles will help with this problem. You can practice stopping your urination while you are going to the bathroom. These are the same muscles you need to strengthen. It is also the muscles you would use if you were trying to stop from passing gas. You can practice tightening these muscles up 10 times a set and repeating this about 3 times per day. Once you know what muscles to tighten up, do not perform these   exercises during urination. It is more likely to cause an infection by backing up the urine.  · Ask for help if you have financial, counseling or nutritional needs during pregnancy. Your caregiver will be able to offer counseling for these needs as well as refer you for other special needs.  · Make a list of emergency phone numbers and have them available.  · Plan on getting help from family or friends when you go home from the hospital.  · Make a trial run to the hospital.  · Take prenatal classes with the father to understand, practice and ask questions about the labor and delivery.  · Prepare the baby's room/nursery.  · Do not travel out of the city unless it is absolutely necessary and with the advice of your caregiver.  · Wear only low or no heal shoes to have better balance and prevent falling.  MEDICATIONS AND DRUG USE IN PREGNANCY  · Take prenatal vitamins as directed. The vitamin should contain 1 milligram of folic acid. Keep all vitamins out of reach of children. Only a couple vitamins or tablets containing iron may be fatal to a baby or young child when ingested.  · Avoid use  of all medications, including herbs, over-the-counter medications, not prescribed or suggested by your caregiver. Only take over-the-counter or prescription medicines for pain, discomfort, or fever as directed by your caregiver. Do not use aspirin, ibuprofen (Motrin®, Advil®, Nuprin®) or naproxen (Aleve®) unless OK'd by your caregiver.  · Let your caregiver also know about herbs you may be using.  · Alcohol is related to a number of birth defects. This includes fetal alcohol syndrome. All alcohol, in any form, should be avoided completely. Smoking will cause low birth rate and premature babies.  · Street/illegal drugs are very harmful to the baby. They are absolutely forbidden. A baby born to an addicted mother will be addicted at birth. The baby will go through the same withdrawal an adult does.  SEEK MEDICAL CARE IF:  You have any concerns or worries during your pregnancy. It is better to call with your questions if you feel they cannot wait, rather than worry about them.  DECISIONS ABOUT CIRCUMCISION  You may or may not know the sex of your baby. If you know your baby is a boy, it may be time to think about circumcision. Circumcision is the removal of the foreskin of the penis. This is the skin that covers the sensitive end of the penis. There is no proven medical need for this. Often this decision is made on what is popular at the time or based upon religious beliefs and social issues. You can discuss these issues with your caregiver or pediatrician.  SEEK IMMEDIATE MEDICAL CARE IF:   · An unexplained oral temperature above 102° F (38.9° C) develops, or as your caregiver suggests.  · You have leaking of fluid from the vagina (birth canal). If leaking membranes are suspected, take your temperature and tell your caregiver of this when you call.  · There is vaginal spotting, bleeding or passing clots. Tell your caregiver of the amount and how many pads are used.  · You develop a bad smelling vaginal discharge with  a change in the color from clear to white.  · You develop vomiting that lasts more than 24 hours.  · You develop chills or fever.  · You develop shortness of breath.  · You develop burning on urination.  · You loose more than 2 pounds of weight   or gain more than 2 pounds of weight or as suggested by your caregiver.  · You notice sudden swelling of your face, hands, and feet or legs.  · You develop belly (abdominal) pain. Round ligament discomfort is a common non-cancerous (benign) cause of abdominal pain in pregnancy. Your caregiver still must evaluate you.  · You develop a severe headache that does not go away.  · You develop visual problems, blurred or double vision.  · If you have not felt your baby move for more than 1 hour. If you think the baby is not moving as much as usual, eat something with sugar in it and lie down on your left side for an hour. The baby should move at least 4 to 5 times per hour. Call right away if your baby moves less than that.  · You fall, are in a car accident or any kind of trauma.  · There is mental or physical violence at home.  Document Released: 12/11/2001 Document Revised: 03/10/2012 Document Reviewed: 06/15/2009  [employer]® Patient Information ©2013 [employer].

## 2012-10-07 LAB — HIV ANTIBODY (ROUTINE TESTING W REFLEX): HIV: NONREACTIVE

## 2012-10-27 ENCOUNTER — Ambulatory Visit (INDEPENDENT_AMBULATORY_CARE_PROVIDER_SITE_OTHER): Payer: Medicaid Other | Admitting: Obstetrics and Gynecology

## 2012-10-27 VITALS — BP 123/79 | Temp 98.7°F | Wt 206.6 lb

## 2012-10-27 DIAGNOSIS — O3410 Maternal care for benign tumor of corpus uteri, unspecified trimester: Secondary | ICD-10-CM

## 2012-10-27 DIAGNOSIS — O0990 Supervision of high risk pregnancy, unspecified, unspecified trimester: Secondary | ICD-10-CM

## 2012-10-27 DIAGNOSIS — Z23 Encounter for immunization: Secondary | ICD-10-CM

## 2012-10-27 LAB — POCT URINALYSIS DIP (DEVICE)
Protein, ur: 30 mg/dL — AB
Specific Gravity, Urine: 1.025 (ref 1.005–1.030)
Urobilinogen, UA: 0.2 mg/dL (ref 0.0–1.0)
pH: 6.5 (ref 5.0–8.0)

## 2012-10-27 MED ORDER — INFLUENZA VIRUS VACC SPLIT PF IM SUSP
0.5000 mL | Freq: Once | INTRAMUSCULAR | Status: AC
  Administered 2012-10-27: 0.5 mL via INTRAMUSCULAR

## 2012-10-27 NOTE — Progress Notes (Signed)
Patient doing well without complaints. FM/PTL precautions reviewed. 1hr GCT results reviewed. Patient reports taking tylenol for fibroid pain 1-2 times a month. Patient desires flu shot today

## 2012-10-27 NOTE — Progress Notes (Signed)
Pulse- 95  Edema- feet Pain/pressure- sides

## 2012-11-13 ENCOUNTER — Ambulatory Visit (INDEPENDENT_AMBULATORY_CARE_PROVIDER_SITE_OTHER): Payer: Medicaid Other | Admitting: Family Medicine

## 2012-11-13 VITALS — BP 116/75 | Temp 97.8°F | Wt 210.7 lb

## 2012-11-13 DIAGNOSIS — O3410 Maternal care for benign tumor of corpus uteri, unspecified trimester: Secondary | ICD-10-CM

## 2012-11-13 DIAGNOSIS — D259 Leiomyoma of uterus, unspecified: Secondary | ICD-10-CM

## 2012-11-13 LAB — POCT URINALYSIS DIP (DEVICE)
Glucose, UA: NEGATIVE mg/dL
Hgb urine dipstick: NEGATIVE
Nitrite: NEGATIVE
Protein, ur: NEGATIVE mg/dL
Urobilinogen, UA: 0.2 mg/dL (ref 0.0–1.0)
pH: 5.5 (ref 5.0–8.0)

## 2012-11-13 NOTE — Progress Notes (Signed)
Pulse 98 Patient reports occasional pelvic pain and some uterine irritability

## 2012-11-13 NOTE — Progress Notes (Signed)
Occasional cramping. No LOF or VB. Good FM. No concerns.

## 2012-11-13 NOTE — Patient Instructions (Signed)
Pregnancy - Third Trimester  The third trimester of pregnancy (the last 3 months) is a period of the most rapid growth for you and your baby. The baby approaches a length of 20 inches and a weight of 6 to 10 pounds. The baby is adding on fat and getting ready for life outside your body. While inside, babies have periods of sleeping and waking, suck their thumbs, and hiccups. You can often feel small contractions of the uterus. This is false labor. It is also called Braxton-Hicks contractions. This is like a practice for labor. The usual problems in this stage of pregnancy include more difficulty breathing, swelling of the hands and feet from water retention, and having to urinate more often because of the uterus and baby pressing on your bladder.   PRENATAL EXAMS  · Blood work may continue to be done during prenatal exams. These tests are done to check on your health and the probable health of your baby. Blood work is used to follow your blood levels (hemoglobin). Anemia (low hemoglobin) is common during pregnancy. Iron and vitamins are given to help prevent this. You may also continue to be checked for diabetes. Some of the past blood tests may be done again.  · The size of the uterus is measured during each visit. This makes sure your baby is growing properly according to your pregnancy dates.  · Your blood pressure is checked every prenatal visit. This is to make sure you are not getting toxemia.  · Your urine is checked every prenatal visit for infection, diabetes and protein.  · Your weight is checked at each visit. This is done to make sure gains are happening at the suggested rate and that you and your baby are growing normally.  · Sometimes, an ultrasound is performed to confirm the position and the proper growth and development of the baby. This is a test done that bounces harmless sound waves off the baby so your caregiver can more accurately determine due dates.  · Discuss the type of pain medication and  anesthesia you will have during your labor and delivery.  · Discuss the possibility and anesthesia if a Cesarean Section might be necessary.  · Inform your caregiver if there is any mental or physical violence at home.  Sometimes, a specialized non-stress test, contraction stress test and biophysical profile are done to make sure the baby is not having a problem. Checking the amniotic fluid surrounding the baby is called an amniocentesis. The amniotic fluid is removed by sticking a needle into the belly (abdomen). This is sometimes done near the end of pregnancy if an early delivery is required. In this case, it is done to help make sure the baby's lungs are mature enough for the baby to live outside of the womb. If the lungs are not mature and it is unsafe to deliver the baby, an injection of cortisone medication is given to the mother 1 to 2 days before the delivery. This helps the baby's lungs mature and makes it safer to deliver the baby.  CHANGES OCCURING IN THE THIRD TRIMESTER OF PREGNANCY  Your body goes through many changes during pregnancy. They vary from person to person. Talk to your caregiver about changes you notice and are concerned about.  · During the last trimester, you have probably had an increase in your appetite. It is normal to have cravings for certain foods. This varies from person to person and pregnancy to pregnancy.  · You may begin to   get stretch marks on your hips, abdomen, and breasts. These are normal changes in the body during pregnancy. There are no exercises or medications to take which prevent this change.  · Constipation may be treated with a stool softener or adding bulk to your diet. Drinking lots of fluids, fiber in vegetables, fruits, and whole grains are helpful.  · Exercising is also helpful. If you have been very active up until your pregnancy, most of these activities can be continued during your pregnancy. If you have been less active, it is helpful to start an exercise  program such as walking. Consult your caregiver before starting exercise programs.  · Avoid all smoking, alcohol, un-prescribed drugs, herbs and "street drugs" during your pregnancy. These chemicals affect the formation and growth of the baby. Avoid chemicals throughout the pregnancy to ensure the delivery of a healthy infant.  · Backache, varicose veins and hemorrhoids may develop or get worse.  · You will tire more easily in the third trimester, which is normal.  · The baby's movements may be stronger and more often.  · You may become short of breath easily.  · Your belly button may stick out.  · A yellow discharge may leak from your breasts called colostrum.  · You may have a bloody mucus discharge. This usually occurs a few days to a week before labor begins.  HOME CARE INSTRUCTIONS   · Keep your caregiver's appointments. Follow your caregiver's instructions regarding medication use, exercise, and diet.  · During pregnancy, you are providing food for you and your baby. Continue to eat regular, well-balanced meals. Choose foods such as meat, fish, milk and other low fat dairy products, vegetables, fruits, and whole-grain breads and cereals. Your caregiver will tell you of the ideal weight gain.  · A physical sexual relationship may be continued throughout pregnancy if there are no other problems such as early (premature) leaking of amniotic fluid from the membranes, vaginal bleeding, or belly (abdominal) pain.  · Exercise regularly if there are no restrictions. Check with your caregiver if you are unsure of the safety of your exercises. Greater weight gain will occur in the last 2 trimesters of pregnancy. Exercising helps:  · Control your weight.  · Get you in shape for labor and delivery.  · You lose weight after you deliver.  · Rest a lot with legs elevated, or as needed for leg cramps or low back pain.  · Wear a good support or jogging bra for breast tenderness during pregnancy. This may help if worn during  sleep. Pads or tissues may be used in the bra if you are leaking colostrum.  · Do not use hot tubs, steam rooms, or saunas.  · Wear your seat belt when driving. This protects you and your baby if you are in an accident.  · Avoid raw meat, cat litter boxes and soil used by cats. These carry germs that can cause birth defects in the baby.  · It is easier to loose urine during pregnancy. Tightening up and strengthening the pelvic muscles will help with this problem. You can practice stopping your urination while you are going to the bathroom. These are the same muscles you need to strengthen. It is also the muscles you would use if you were trying to stop from passing gas. You can practice tightening these muscles up 10 times a set and repeating this about 3 times per day. Once you know what muscles to tighten up, do not perform these   exercises during urination. It is more likely to cause an infection by backing up the urine.  · Ask for help if you have financial, counseling or nutritional needs during pregnancy. Your caregiver will be able to offer counseling for these needs as well as refer you for other special needs.  · Make a list of emergency phone numbers and have them available.  · Plan on getting help from family or friends when you go home from the hospital.  · Make a trial run to the hospital.  · Take prenatal classes with the father to understand, practice and ask questions about the labor and delivery.  · Prepare the baby's room/nursery.  · Do not travel out of the city unless it is absolutely necessary and with the advice of your caregiver.  · Wear only low or no heal shoes to have better balance and prevent falling.  MEDICATIONS AND DRUG USE IN PREGNANCY  · Take prenatal vitamins as directed. The vitamin should contain 1 milligram of folic acid. Keep all vitamins out of reach of children. Only a couple vitamins or tablets containing iron may be fatal to a baby or young child when ingested.  · Avoid use  of all medications, including herbs, over-the-counter medications, not prescribed or suggested by your caregiver. Only take over-the-counter or prescription medicines for pain, discomfort, or fever as directed by your caregiver. Do not use aspirin, ibuprofen (Motrin®, Advil®, Nuprin®) or naproxen (Aleve®) unless OK'd by your caregiver.  · Let your caregiver also know about herbs you may be using.  · Alcohol is related to a number of birth defects. This includes fetal alcohol syndrome. All alcohol, in any form, should be avoided completely. Smoking will cause low birth rate and premature babies.  · Street/illegal drugs are very harmful to the baby. They are absolutely forbidden. A baby born to an addicted mother will be addicted at birth. The baby will go through the same withdrawal an adult does.  SEEK MEDICAL CARE IF:  You have any concerns or worries during your pregnancy. It is better to call with your questions if you feel they cannot wait, rather than worry about them.  DECISIONS ABOUT CIRCUMCISION  You may or may not know the sex of your baby. If you know your baby is a boy, it may be time to think about circumcision. Circumcision is the removal of the foreskin of the penis. This is the skin that covers the sensitive end of the penis. There is no proven medical need for this. Often this decision is made on what is popular at the time or based upon religious beliefs and social issues. You can discuss these issues with your caregiver or pediatrician.  SEEK IMMEDIATE MEDICAL CARE IF:   · An unexplained oral temperature above 102° F (38.9° C) develops, or as your caregiver suggests.  · You have leaking of fluid from the vagina (birth canal). If leaking membranes are suspected, take your temperature and tell your caregiver of this when you call.  · There is vaginal spotting, bleeding or passing clots. Tell your caregiver of the amount and how many pads are used.  · You develop a bad smelling vaginal discharge with  a change in the color from clear to white.  · You develop vomiting that lasts more than 24 hours.  · You develop chills or fever.  · You develop shortness of breath.  · You develop burning on urination.  · You loose more than 2 pounds of weight   or gain more than 2 pounds of weight or as suggested by your caregiver.  · You notice sudden swelling of your face, hands, and feet or legs.  · You develop belly (abdominal) pain. Round ligament discomfort is a common non-cancerous (benign) cause of abdominal pain in pregnancy. Your caregiver still must evaluate you.  · You develop a severe headache that does not go away.  · You develop visual problems, blurred or double vision.  · If you have not felt your baby move for more than 1 hour. If you think the baby is not moving as much as usual, eat something with sugar in it and lie down on your left side for an hour. The baby should move at least 4 to 5 times per hour. Call right away if your baby moves less than that.  · You fall, are in a car accident or any kind of trauma.  · There is mental or physical violence at home.  Document Released: 12/11/2001 Document Revised: 03/10/2012 Document Reviewed: 06/15/2009  [employer]® Patient Information ©2013 [employer].

## 2012-12-04 ENCOUNTER — Ambulatory Visit (INDEPENDENT_AMBULATORY_CARE_PROVIDER_SITE_OTHER): Payer: 59 | Admitting: Advanced Practice Midwife

## 2012-12-04 ENCOUNTER — Other Ambulatory Visit (HOSPITAL_COMMUNITY)
Admission: RE | Admit: 2012-12-04 | Discharge: 2012-12-04 | Disposition: A | Discharge status: Home / Self Care | Payer: Medicaid Other | Source: Ambulatory Visit | Attending: Advanced Practice Midwife | Admitting: Advanced Practice Midwife

## 2012-12-04 VITALS — BP 131/71 | Temp 97.4°F | Wt 217.0 lb

## 2012-12-04 DIAGNOSIS — O3410 Maternal care for benign tumor of corpus uteri, unspecified trimester: Secondary | ICD-10-CM

## 2012-12-04 DIAGNOSIS — Z113 Encounter for screening for infections with a predominantly sexual mode of transmission: Secondary | ICD-10-CM | POA: Insufficient documentation

## 2012-12-04 DIAGNOSIS — O0990 Supervision of high risk pregnancy, unspecified, unspecified trimester: Secondary | ICD-10-CM

## 2012-12-04 LAB — POCT URINALYSIS DIP (DEVICE)
Hgb urine dipstick: NEGATIVE
Protein, ur: NEGATIVE mg/dL
Specific Gravity, Urine: 1.01 (ref 1.005–1.030)
Urobilinogen, UA: 0.2 mg/dL (ref 0.0–1.0)
pH: 6.5 (ref 5.0–8.0)

## 2012-12-04 NOTE — Progress Notes (Signed)
U/S scheduled 12/05/12 at 9 am at [facility].

## 2012-12-04 NOTE — Progress Notes (Signed)
[place] for multiple large fibroids, including in LUS and poor weight gain.

## 2012-12-04 NOTE — Patient Instructions (Signed)
Pregnancy - Third Trimester  The third trimester of pregnancy (the last 3 months) is a period of the most rapid growth for you and your baby. The baby approaches a length of 20 inches and a weight of 6 to 10 pounds. The baby is adding on fat and getting ready for life outside your body. While inside, babies have periods of sleeping and waking, suck their thumbs, and hiccups. You can often feel small contractions of the uterus. This is false labor. It is also called Braxton-Hicks contractions. This is like a practice for labor. The usual problems in this stage of pregnancy include more difficulty breathing, swelling of the hands and feet from water retention, and having to urinate more often because of the uterus and baby pressing on your bladder.   PRENATAL EXAMS  · Blood work may continue to be done during prenatal exams. These tests are done to check on your health and the probable health of your baby. Blood work is used to follow your blood levels (hemoglobin). Anemia (low hemoglobin) is common during pregnancy. Iron and vitamins are given to help prevent this. You may also continue to be checked for diabetes. Some of the past blood tests may be done again.  · The size of the uterus is measured during each visit. This makes sure your baby is growing properly according to your pregnancy dates.  · Your blood pressure is checked every prenatal visit. This is to make sure you are not getting toxemia.  · Your urine is checked every prenatal visit for infection, diabetes and protein.  · Your weight is checked at each visit. This is done to make sure gains are happening at the suggested rate and that you and your baby are growing normally.  · Sometimes, an ultrasound is performed to confirm the position and the proper growth and development of the baby. This is a test done that bounces harmless sound waves off the baby so your caregiver can more accurately determine due dates.  · Discuss the type of pain medication and  anesthesia you will have during your labor and delivery.  · Discuss the possibility and anesthesia if a Cesarean Section might be necessary.  · Inform your caregiver if there is any mental or physical violence at home.  Sometimes, a specialized non-stress test, contraction stress test and biophysical profile are done to make sure the baby is not having a problem. Checking the amniotic fluid surrounding the baby is called an amniocentesis. The amniotic fluid is removed by sticking a needle into the belly (abdomen). This is sometimes done near the end of pregnancy if an early delivery is required. In this case, it is done to help make sure the baby's lungs are mature enough for the baby to live outside of the womb. If the lungs are not mature and it is unsafe to deliver the baby, an injection of cortisone medication is given to the mother 1 to 2 days before the delivery. This helps the baby's lungs mature and makes it safer to deliver the baby.  CHANGES OCCURING IN THE THIRD TRIMESTER OF PREGNANCY  Your body goes through many changes during pregnancy. They vary from person to person. Talk to your caregiver about changes you notice and are concerned about.  · During the last trimester, you have probably had an increase in your appetite. It is normal to have cravings for certain foods. This varies from person to person and pregnancy to pregnancy.  · You may begin to   get stretch marks on your hips, abdomen, and breasts. These are normal changes in the body during pregnancy. There are no exercises or medications to take which prevent this change.  · Constipation may be treated with a stool softener or adding bulk to your diet. Drinking lots of fluids, fiber in vegetables, fruits, and whole grains are helpful.  · Exercising is also helpful. If you have been very active up until your pregnancy, most of these activities can be continued during your pregnancy. If you have been less active, it is helpful to start an exercise  program such as walking. Consult your caregiver before starting exercise programs.  · Avoid all smoking, alcohol, un-prescribed drugs, herbs and "street drugs" during your pregnancy. These chemicals affect the formation and growth of the baby. Avoid chemicals throughout the pregnancy to ensure the delivery of a healthy infant.  · Backache, varicose veins and hemorrhoids may develop or get worse.  · You will tire more easily in the third trimester, which is normal.  · The baby's movements may be stronger and more often.  · You may become short of breath easily.  · Your belly button may stick out.  · A yellow discharge may leak from your breasts called colostrum.  · You may have a bloody mucus discharge. This usually occurs a few days to a week before labor begins.  HOME CARE INSTRUCTIONS   · Keep your caregiver's appointments. Follow your caregiver's instructions regarding medication use, exercise, and diet.  · During pregnancy, you are providing food for you and your baby. Continue to eat regular, well-balanced meals. Choose foods such as meat, fish, milk and other low fat dairy products, vegetables, fruits, and whole-grain breads and cereals. Your caregiver will tell you of the ideal weight gain.  · A physical sexual relationship may be continued throughout pregnancy if there are no other problems such as early (premature) leaking of amniotic fluid from the membranes, vaginal bleeding, or belly (abdominal) pain.  · Exercise regularly if there are no restrictions. Check with your caregiver if you are unsure of the safety of your exercises. Greater weight gain will occur in the last 2 trimesters of pregnancy. Exercising helps:  · Control your weight.  · Get you in shape for labor and delivery.  · You lose weight after you deliver.  · Rest a lot with legs elevated, or as needed for leg cramps or low back pain.  · Wear a good support or jogging bra for breast tenderness during pregnancy. This may help if worn during  sleep. Pads or tissues may be used in the bra if you are leaking colostrum.  · Do not use hot tubs, steam rooms, or saunas.  · Wear your seat belt when driving. This protects you and your baby if you are in an accident.  · Avoid raw meat, cat litter boxes and soil used by cats. These carry germs that can cause birth defects in the baby.  · It is easier to loose urine during pregnancy. Tightening up and strengthening the pelvic muscles will help with this problem. You can practice stopping your urination while you are going to the bathroom. These are the same muscles you need to strengthen. It is also the muscles you would use if you were trying to stop from passing gas. You can practice tightening these muscles up 10 times a set and repeating this about 3 times per day. Once you know what muscles to tighten up, do not perform these   exercises during urination. It is more likely to cause an infection by backing up the urine.  · Ask for help if you have financial, counseling or nutritional needs during pregnancy. Your caregiver will be able to offer counseling for these needs as well as refer you for other special needs.  · Make a list of emergency phone numbers and have them available.  · Plan on getting help from family or friends when you go home from the hospital.  · Make a trial run to the hospital.  · Take prenatal classes with the father to understand, practice and ask questions about the labor and delivery.  · Prepare the baby's room/nursery.  · Do not travel out of the city unless it is absolutely necessary and with the advice of your caregiver.  · Wear only low or no heal shoes to have better balance and prevent falling.  MEDICATIONS AND DRUG USE IN PREGNANCY  · Take prenatal vitamins as directed. The vitamin should contain 1 milligram of folic acid. Keep all vitamins out of reach of children. Only a couple vitamins or tablets containing iron may be fatal to a baby or young child when ingested.  · Avoid use  of all medications, including herbs, over-the-counter medications, not prescribed or suggested by your caregiver. Only take over-the-counter or prescription medicines for pain, discomfort, or fever as directed by your caregiver. Do not use aspirin, ibuprofen (Motrin®, Advil®, Nuprin®) or naproxen (Aleve®) unless OK'd by your caregiver.  · Let your caregiver also know about herbs you may be using.  · Alcohol is related to a number of birth defects. This includes fetal alcohol syndrome. All alcohol, in any form, should be avoided completely. Smoking will cause low birth rate and premature babies.  · Street/illegal drugs are very harmful to the baby. They are absolutely forbidden. A baby born to an addicted mother will be addicted at birth. The baby will go through the same withdrawal an adult does.  SEEK MEDICAL CARE IF:  You have any concerns or worries during your pregnancy. It is better to call with your questions if you feel they cannot wait, rather than worry about them.  DECISIONS ABOUT CIRCUMCISION  You may or may not know the sex of your baby. If you know your baby is a boy, it may be time to think about circumcision. Circumcision is the removal of the foreskin of the penis. This is the skin that covers the sensitive end of the penis. There is no proven medical need for this. Often this decision is made on what is popular at the time or based upon religious beliefs and social issues. You can discuss these issues with your caregiver or pediatrician.  SEEK IMMEDIATE MEDICAL CARE IF:   · An unexplained oral temperature above 102° F (38.9° C) develops, or as your caregiver suggests.  · You have leaking of fluid from the vagina (birth canal). If leaking membranes are suspected, take your temperature and tell your caregiver of this when you call.  · There is vaginal spotting, bleeding or passing clots. Tell your caregiver of the amount and how many pads are used.  · You develop a bad smelling vaginal discharge with  a change in the color from clear to white.  · You develop vomiting that lasts more than 24 hours.  · You develop chills or fever.  · You develop shortness of breath.  · You develop burning on urination.  · You loose more than 2 pounds of weight   or gain more than 2 pounds of weight or as suggested by your caregiver.  · You notice sudden swelling of your face, hands, and feet or legs.  · You develop belly (abdominal) pain. Round ligament discomfort is a common non-cancerous (benign) cause of abdominal pain in pregnancy. Your caregiver still must evaluate you.  · You develop a severe headache that does not go away.  · You develop visual problems, blurred or double vision.  · If you have not felt your baby move for more than 1 hour. If you think the baby is not moving as much as usual, eat something with sugar in it and lie down on your left side for an hour. The baby should move at least 4 to 5 times per hour. Call right away if your baby moves less than that.  · You fall, are in a car accident or any kind of trauma.  · There is mental or physical violence at home.  Document Released: 12/11/2001 Document Revised: 03/10/2012 Document Reviewed: 06/15/2009  [employer]® Patient Information ©2013 [employer].

## 2012-12-04 NOTE — Progress Notes (Signed)
P = 90 Mild edema in hands and feet Occasional cramping and low back pain

## 2012-12-05 ENCOUNTER — Encounter (HOSPITAL_COMMUNITY): Payer: Self-pay

## 2012-12-05 ENCOUNTER — Ambulatory Visit (HOSPITAL_COMMUNITY)
Admission: RE | Admit: 2012-12-05 | Discharge: 2012-12-05 | Disposition: A | Discharge status: Home / Self Care | Payer: 59 | Source: Ambulatory Visit | Attending: Advanced Practice Midwife | Admitting: Advanced Practice Midwife

## 2012-12-05 DIAGNOSIS — O3410 Maternal care for benign tumor of corpus uteri, unspecified trimester: Secondary | ICD-10-CM | POA: Diagnosis present

## 2012-12-05 DIAGNOSIS — D259 Leiomyoma of uterus, unspecified: Secondary | ICD-10-CM

## 2012-12-05 DIAGNOSIS — O289 Unspecified abnormal findings on antenatal screening of mother: Secondary | ICD-10-CM | POA: Diagnosis not present

## 2012-12-05 DIAGNOSIS — O0990 Supervision of high risk pregnancy, unspecified, unspecified trimester: Secondary | ICD-10-CM

## 2012-12-05 NOTE — Progress Notes (Signed)
Ms. Kushner was seen for ultrasound appointment today.  Please see [facility] report for details.

## 2012-12-09 ENCOUNTER — Encounter: Payer: Self-pay | Admitting: Advanced Practice Midwife

## 2012-12-09 DIAGNOSIS — O358XX0 Maternal care for other (suspected) fetal abnormality and damage, not applicable or unspecified: Secondary | ICD-10-CM | POA: Insufficient documentation

## 2012-12-09 DIAGNOSIS — D259 Leiomyoma of uterus, unspecified: Secondary | ICD-10-CM | POA: Insufficient documentation

## 2012-12-11 ENCOUNTER — Encounter: Payer: Self-pay | Admitting: Advanced Practice Midwife

## 2012-12-11 ENCOUNTER — Ambulatory Visit (INDEPENDENT_AMBULATORY_CARE_PROVIDER_SITE_OTHER): Payer: Medicaid Other | Admitting: Obstetrics and Gynecology

## 2012-12-11 ENCOUNTER — Encounter: Payer: Self-pay | Admitting: Obstetrics and Gynecology

## 2012-12-11 VITALS — BP 139/89 | Temp 97.2°F | Wt 219.0 lb

## 2012-12-11 DIAGNOSIS — O0990 Supervision of high risk pregnancy, unspecified, unspecified trimester: Secondary | ICD-10-CM

## 2012-12-11 DIAGNOSIS — D259 Leiomyoma of uterus, unspecified: Secondary | ICD-10-CM

## 2012-12-11 DIAGNOSIS — O358XX0 Maternal care for other (suspected) fetal abnormality and damage, not applicable or unspecified: Secondary | ICD-10-CM

## 2012-12-11 DIAGNOSIS — O3410 Maternal care for benign tumor of corpus uteri, unspecified trimester: Secondary | ICD-10-CM

## 2012-12-11 LAB — POCT URINALYSIS DIP (DEVICE)
Bilirubin Urine: NEGATIVE
Glucose, UA: NEGATIVE mg/dL
Hgb urine dipstick: NEGATIVE
Hgb urine dipstick: NEGATIVE
Nitrite: NEGATIVE
Protein, ur: NEGATIVE mg/dL
Protein, ur: NEGATIVE mg/dL
Specific Gravity, Urine: 1.015 (ref 1.005–1.030)
Urobilinogen, UA: 0.2 mg/dL (ref 0.0–1.0)
pH: 6.5 (ref 5.0–8.0)
pH: 6.5 (ref 5.0–8.0)

## 2012-12-11 NOTE — Progress Notes (Signed)
Patient doing well without complaints. FM/labor precautions reviewed. Results of GBS reviewed and explained

## 2012-12-11 NOTE — Progress Notes (Signed)
p-80 

## 2012-12-13 ENCOUNTER — Encounter: Payer: Self-pay | Admitting: Advanced Practice Midwife

## 2012-12-13 DIAGNOSIS — O99820 Streptococcus B carrier state complicating pregnancy: Secondary | ICD-10-CM | POA: Insufficient documentation

## 2012-12-18 ENCOUNTER — Encounter (HOSPITAL_COMMUNITY): Payer: Self-pay

## 2012-12-18 ENCOUNTER — Inpatient Hospital Stay (HOSPITAL_COMMUNITY)
Admission: AD | Admit: 2012-12-18 | Discharge: 2012-12-20 | DRG: 775 | Disposition: A | Discharge status: Home / Self Care | Payer: 59 | Source: Ambulatory Visit | Attending: Obstetrics & Gynecology | Admitting: Obstetrics & Gynecology

## 2012-12-18 ENCOUNTER — Inpatient Hospital Stay (HOSPITAL_COMMUNITY): Payer: 59 | Admitting: Anesthesiology

## 2012-12-18 ENCOUNTER — Encounter: Payer: 59 | Admitting: Obstetrics & Gynecology

## 2012-12-18 ENCOUNTER — Encounter (HOSPITAL_COMMUNITY): Payer: Self-pay | Admitting: Anesthesiology

## 2012-12-18 DIAGNOSIS — O9989 Other specified diseases and conditions complicating pregnancy, childbirth and the puerperium: Secondary | ICD-10-CM

## 2012-12-18 DIAGNOSIS — O99820 Streptococcus B carrier state complicating pregnancy: Secondary | ICD-10-CM

## 2012-12-18 DIAGNOSIS — Z22330 Carrier of Group B streptococcus: Secondary | ICD-10-CM

## 2012-12-18 DIAGNOSIS — O3410 Maternal care for benign tumor of corpus uteri, unspecified trimester: Secondary | ICD-10-CM

## 2012-12-18 DIAGNOSIS — D259 Leiomyoma of uterus, unspecified: Secondary | ICD-10-CM

## 2012-12-18 DIAGNOSIS — O99892 Other specified diseases and conditions complicating childbirth: Secondary | ICD-10-CM | POA: Diagnosis present

## 2012-12-18 DIAGNOSIS — Z803 Family history of malignant neoplasm of breast: Secondary | ICD-10-CM

## 2012-12-18 DIAGNOSIS — O4290 Premature rupture of membranes, unspecified as to length of time between rupture and onset of labor, unspecified weeks of gestation: Secondary | ICD-10-CM | POA: Diagnosis present

## 2012-12-18 DIAGNOSIS — O09899 Supervision of other high risk pregnancies, unspecified trimester: Secondary | ICD-10-CM

## 2012-12-18 DIAGNOSIS — O358XX0 Maternal care for other (suspected) fetal abnormality and damage, not applicable or unspecified: Secondary | ICD-10-CM

## 2012-12-18 LAB — COMPREHENSIVE METABOLIC PANEL
ALT: 9 U/L (ref 0–35)
AST: 16 U/L (ref 0–37)
AST: 24 U/L (ref 0–37)
Albumin: 2.6 g/dL — ABNORMAL LOW (ref 3.5–5.2)
Albumin: 2.2 g/dL — ABNORMAL LOW (ref 3.5–5.2)
Alkaline Phosphatase: 109 U/L (ref 39–117)
Calcium: 9.5 mg/dL (ref 8.4–10.5)
Chloride: 99 mEq/L (ref 96–112)
Creatinine, Ser: 0.86 mg/dL (ref 0.50–1.10)
Potassium: 3.6 mEq/L (ref 3.5–5.1)
Sodium: 132 mEq/L — ABNORMAL LOW (ref 135–145)
Total Bilirubin: 0.6 mg/dL (ref 0.3–1.2)
Total Protein: 6.8 g/dL (ref 6.0–8.3)
Total Protein: 6.4 g/dL (ref 6.0–8.3)

## 2012-12-18 LAB — CBC
MCH: 28.3 pg (ref 26.0–34.0)
MCHC: 32.9 g/dL (ref 30.0–36.0)
MCV: 84.7 fL (ref 78.0–100.0)
MCV: 86.1 fL (ref 78.0–100.0)
Platelets: 299 10*3/uL (ref 150–400)
Platelets: 267 10*3/uL (ref 150–400)
RBC: 4.13 MIL/uL (ref 3.87–5.11)
RDW: 15.7 % — ABNORMAL HIGH (ref 11.5–15.5)
WBC: 8.1 10*3/uL (ref 4.0–10.5)
WBC: 11.7 10*3/uL — ABNORMAL HIGH (ref 4.0–10.5)

## 2012-12-18 LAB — PROTEIN / CREATININE RATIO, URINE
Creatinine, Urine: 61.66 mg/dL
Protein Creatinine Ratio: 0.15 (ref 0.00–0.15)
Total Protein, Urine: 9.3 mg/dL

## 2012-12-18 LAB — RPR: RPR Ser Ql: NONREACTIVE

## 2012-12-18 MED ORDER — LACTATED RINGERS IV SOLN
INTRAVENOUS | Status: DC
  Administered 2012-12-18 (×2): via INTRAUTERINE

## 2012-12-18 MED ORDER — OXYCODONE-ACETAMINOPHEN 5-325 MG PO TABS: 1.0000 | ORAL_TABLET | ORAL | Status: DC | PRN

## 2012-12-18 MED ORDER — PRENATAL MULTIVITAMIN CH
1.0000 | ORAL_TABLET | Freq: Every day | ORAL | Status: DC
  Administered 2012-12-18 – 2012-12-20 (×3): 1 via ORAL
  Filled 2012-12-18 (×3): qty 1

## 2012-12-18 MED ORDER — OXYTOCIN 40 UNITS IN LACTATED RINGERS INFUSION - SIMPLE MED
62.5000 mL/h | INTRAVENOUS | Status: DC
  Administered 2012-12-18: 62.5 mL/h via INTRAVENOUS

## 2012-12-18 MED ORDER — SENNOSIDES-DOCUSATE SODIUM 8.6-50 MG PO TABS
2.0000 | ORAL_TABLET | Freq: Every day | ORAL | Status: DC
  Administered 2012-12-18 – 2012-12-19 (×2): 2 via ORAL

## 2012-12-18 MED ORDER — OXYTOCIN BOLUS FROM INFUSION: 500.0000 mL | INTRAVENOUS | Status: DC

## 2012-12-18 MED ORDER — IBUPROFEN 600 MG PO TABS
600.0000 mg | ORAL_TABLET | Freq: Four times a day (QID) | ORAL | Status: DC
  Administered 2012-12-18 – 2012-12-20 (×7): 600 mg via ORAL
  Filled 2012-12-18 (×7): qty 1

## 2012-12-18 MED ORDER — ACETAMINOPHEN 325 MG PO TABS: 650.0000 mg | ORAL_TABLET | ORAL | Status: DC | PRN

## 2012-12-18 MED ORDER — ONDANSETRON HCL 4 MG PO TABS: 4.0000 mg | ORAL_TABLET | ORAL | Status: DC | PRN

## 2012-12-18 MED ORDER — FENTANYL 2.5 MCG/ML BUPIVACAINE 1/10 % EPIDURAL INFUSION (WH - ANES)
14.0000 mL/h | INTRAMUSCULAR | Status: DC
  Administered 2012-12-18 (×3): 14 mL/h via EPIDURAL
  Filled 2012-12-18 (×3): qty 125

## 2012-12-18 MED ORDER — LACTATED RINGERS IV SOLN
500.0000 mL | Freq: Once | INTRAVENOUS | Status: AC
  Administered 2012-12-18: 1000 mL via INTRAVENOUS

## 2012-12-18 MED ORDER — BENZOCAINE-MENTHOL 20-0.5 % EX AERO
1.0000 "application " | INHALATION_SPRAY | CUTANEOUS | Status: DC | PRN
  Administered 2012-12-18: 1 via TOPICAL
  Filled 2012-12-18: qty 56

## 2012-12-18 MED ORDER — PENICILLIN G POTASSIUM 5000000 UNITS IJ SOLR
2.5000 10*6.[IU] | INTRAVENOUS | Status: DC
  Administered 2012-12-18 (×3): 2.5 10*6.[IU] via INTRAVENOUS
  Filled 2012-12-18 (×7): qty 2.5

## 2012-12-18 MED ORDER — LANOLIN HYDROUS EX OINT: 1.0000 "application " | TOPICAL_OINTMENT | CUTANEOUS | Status: DC | PRN

## 2012-12-18 MED ORDER — LACTATED RINGERS IV SOLN: 500.0000 mL | INTRAVENOUS | Status: DC | PRN

## 2012-12-18 MED ORDER — WITCH HAZEL-GLYCERIN EX PADS: 1.0000 "application " | MEDICATED_PAD | CUTANEOUS | Status: DC | PRN

## 2012-12-18 MED ORDER — FENTANYL CITRATE 0.05 MG/ML IJ SOLN: 100.0000 ug | INTRAMUSCULAR | Status: DC | PRN

## 2012-12-18 MED ORDER — SODIUM BICARBONATE 8.4 % IV SOLN
INTRAVENOUS | Status: DC | PRN
  Administered 2012-12-18: 5 mL via EPIDURAL

## 2012-12-18 MED ORDER — CITRIC ACID-SODIUM CITRATE 334-500 MG/5ML PO SOLN: 30.0000 mL | ORAL | Status: DC | PRN

## 2012-12-18 MED ORDER — ONDANSETRON HCL 4 MG/2ML IJ SOLN: 4.0000 mg | INTRAMUSCULAR | Status: DC | PRN

## 2012-12-18 MED ORDER — DIPHENHYDRAMINE HCL 25 MG PO CAPS: 25.0000 mg | ORAL_CAPSULE | Freq: Four times a day (QID) | ORAL | Status: DC | PRN

## 2012-12-18 MED ORDER — EPHEDRINE 5 MG/ML INJ
10.0000 mg | INTRAVENOUS | Status: DC | PRN
  Filled 2012-12-18: qty 4

## 2012-12-18 MED ORDER — EPHEDRINE 5 MG/ML INJ: 10.0000 mg | INTRAVENOUS | Status: DC | PRN

## 2012-12-18 MED ORDER — PENICILLIN G POTASSIUM 5000000 UNITS IJ SOLR
5.0000 10*6.[IU] | Freq: Once | INTRAMUSCULAR | Status: AC
  Administered 2012-12-18: 5 10*6.[IU] via INTRAVENOUS
  Filled 2012-12-18: qty 5

## 2012-12-18 MED ORDER — MEASLES, MUMPS & RUBELLA VAC ~~LOC~~ INJ: 0.5000 mL | INJECTION | Freq: Once | SUBCUTANEOUS | Status: DC

## 2012-12-18 MED ORDER — DIPHENHYDRAMINE HCL 50 MG/ML IJ SOLN: 12.5000 mg | INTRAMUSCULAR | Status: DC | PRN

## 2012-12-18 MED ORDER — LIDOCAINE HCL (PF) 1 % IJ SOLN
30.0000 mL | INTRAMUSCULAR | Status: DC | PRN
  Administered 2012-12-18: 30 mL via SUBCUTANEOUS
  Filled 2012-12-18: qty 30

## 2012-12-18 MED ORDER — PHENYLEPHRINE 40 MCG/ML (10ML) SYRINGE FOR IV PUSH (FOR BLOOD PRESSURE SUPPORT): 80.0000 ug | PREFILLED_SYRINGE | INTRAVENOUS | Status: DC | PRN

## 2012-12-18 MED ORDER — LACTATED RINGERS IV SOLN
INTRAVENOUS | Status: DC
  Administered 2012-12-18 (×3): via INTRAVENOUS

## 2012-12-18 MED ORDER — IBUPROFEN 600 MG PO TABS: 600.0000 mg | ORAL_TABLET | Freq: Four times a day (QID) | ORAL | Status: DC | PRN

## 2012-12-18 MED ORDER — ZOLPIDEM TARTRATE 5 MG PO TABS: 5.0000 mg | ORAL_TABLET | Freq: Every evening | ORAL | Status: DC | PRN

## 2012-12-18 MED ORDER — SIMETHICONE 80 MG PO CHEW: 80.0000 mg | CHEWABLE_TABLET | ORAL | Status: DC | PRN

## 2012-12-18 MED ORDER — TERBUTALINE SULFATE 1 MG/ML IJ SOLN: 0.2500 mg | Freq: Once | INTRAMUSCULAR | Status: DC | PRN

## 2012-12-18 MED ORDER — FERROUS SULFATE 325 (65 FE) MG PO TABS
325.0000 mg | ORAL_TABLET | Freq: Two times a day (BID) | ORAL | Status: DC
  Administered 2012-12-19 – 2012-12-20 (×3): 325 mg via ORAL
  Filled 2012-12-18 (×3): qty 1

## 2012-12-18 MED ORDER — DIBUCAINE 1 % RE OINT: 1.0000 "application " | TOPICAL_OINTMENT | RECTAL | Status: DC | PRN

## 2012-12-18 MED ORDER — OXYTOCIN 40 UNITS IN LACTATED RINGERS INFUSION - SIMPLE MED
1.0000 m[IU]/min | INTRAVENOUS | Status: DC
  Administered 2012-12-18: 5 m[IU]/min via INTRAVENOUS

## 2012-12-18 MED ORDER — OXYTOCIN 40 UNITS IN LACTATED RINGERS INFUSION - SIMPLE MED
1.0000 m[IU]/min | INTRAVENOUS | Status: DC
  Administered 2012-12-18: 2 m[IU]/min via INTRAVENOUS
  Filled 2012-12-18 (×2): qty 1000

## 2012-12-18 MED ORDER — TETANUS-DIPHTH-ACELL PERTUSSIS 5-2.5-18.5 LF-MCG/0.5 IM SUSP
0.5000 mL | Freq: Once | INTRAMUSCULAR | Status: AC
  Administered 2012-12-19: 0.5 mL via INTRAMUSCULAR
  Filled 2012-12-18: qty 0.5

## 2012-12-18 MED ORDER — ONDANSETRON HCL 4 MG/2ML IJ SOLN: 4.0000 mg | Freq: Four times a day (QID) | INTRAMUSCULAR | Status: DC | PRN

## 2012-12-18 MED ORDER — FLEET ENEMA 7-19 GM/118ML RE ENEM: 1.0000 | ENEMA | RECTAL | Status: DC | PRN

## 2012-12-18 MED ORDER — MAGNESIUM HYDROXIDE 400 MG/5ML PO SUSP: 30.0000 mL | ORAL | Status: DC | PRN

## 2012-12-18 MED ORDER — PHENYLEPHRINE 40 MCG/ML (10ML) SYRINGE FOR IV PUSH (FOR BLOOD PRESSURE SUPPORT)
80.0000 ug | PREFILLED_SYRINGE | INTRAVENOUS | Status: DC | PRN
  Filled 2012-12-18: qty 5

## 2012-12-18 NOTE — MAU Note (Signed)
Pt presents for possible ROM at 2345.  States having a gush and now a continuous flow of clear fluid.  GBS positive.  Denies any bleeding.  Good fetal movement.  Irregular contractions.

## 2012-12-18 NOTE — Progress Notes (Signed)
Barbara Oconnell is a 32 y.o. G2P0010 at [redacted]w[redacted]d.  Subjective: Mild rectal pressure  Objective: BP 152/94  Pulse 98  Temp 98.8 F (37.1 C) (Axillary)  Resp 20  Ht 5\' 1"  (1.549 m)  Wt 99.338 kg (219 lb)  BMI 41.38 kg/m2  SpO2 100%  LMP 04/04/2012      FHT:  FHR: 140 bpm, variability: minimal ,  accelerations:  Present,  decelerations:  Present early and few mild variable decels UC:   regular, every 2-4 minutes, strong. IUPC may be falling out SVE:   Dilation: 10 Effacement (%): 100 Station: +3 Exam by:: Barbara Oconnell, CNM  Labs: Lab Results  Component Value Date   WBC 11.7* 12/18/2012   HGB 11.6* 12/18/2012   HCT 35.3* 12/18/2012   MCV 86.1 12/18/2012   PLT 267 12/18/2012    Assessment / Plan: Augmentation of labor, progressing well  Labor: Progressing normally Preeclampsia:  labs stable Fetal Wellbeing:  Category II Pain Control:  Epidural I/D:  n/a Anticipated MOD:  NSVD  Barbara Oconnell 12/18/2012, 3:40 PM

## 2012-12-18 NOTE — H&P (Addendum)
Barbara Oconnell is a 32 y.o. female presenting for PROM.  Pt reports gush of clear liquid around 23:30 on 12/17/12. Denies any bleeding. Good fetal movement. Irregular contractions  Maternal Medical History:  Reason for admission: Reason for admission: rupture of membranes.  Reason for Admission:   nauseaContractions: Onset was 1-2 hours ago.   Frequency: irregular.   Duration is approximately 7 minutes.   Perceived severity is mild.    Fetal activity: Perceived fetal activity is normal.   Last perceived fetal movement was within the past hour.    Prenatal complications: No bleeding, HIV or hypertension.     OB History    Grav Para Term Preterm Abortions TAB SAB Ect Mult Living   2 0 0 0 1 0 1 0 0 0      Past Medical History  Diagnosis Date  . Anemia   . Fibroids   . Chlamydia    Past Surgical History  Procedure Date  . Dilation and curettage of uterus 2007   Family History: family history includes Cancer in her mother and Diabetes in her father. Social History:  reports that she has never smoked. She does not have any smokeless tobacco history on file. She reports that she does not drink alcohol or use illicit drugs.   Prenatal Transfer Tool  Maternal Diabetes: No Genetic Screening: Abnormal:  Results: Elevated risk of Trisomy 21 Maternal Ultrasounds/Referrals: Normal Fetal Ultrasounds or other Referrals:  None, Referred to Materal Fetal Medicine  Maternal Substance Abuse:  No Significant Maternal Medications:  None Significant Maternal Lab Results:  Lab values include: Group B Strep positive Other Comments:  None  Review of Systems  Constitutional: Negative for fever.  Eyes: Negative for blurred vision.  Respiratory: Negative for cough.   Cardiovascular: Negative for chest pain and palpitations.  Gastrointestinal: Negative for nausea.  Genitourinary: Negative for dysuria and frequency.  Neurological: Negative for dizziness and headaches.    Dilation:  1 Effacement (%): 90 Station: -2 Exam by:: B. Larimore, RN Blood pressure 115/58, pulse 83, temperature 97.5 F (36.4 C), temperature source Oral, resp. rate 20, last menstrual period 04/04/2012. Maternal Exam:  Uterine Assessment: Contraction strength is mild.  Contraction frequency is irregular.   Abdomen: Fetal presentation: vertex  Introitus: Ferning test: positive.  Nitrazine test: not done. Amniotic fluid character: clear.     Fetal Exam Fetal Monitor Review: Mode: hand-held doppler probe.   Baseline rate: 140.  Variability: moderate (6-25 bpm).   Pattern: accelerations present and no decelerations.    Fetal State Assessment: Category I - tracings are normal.     Physical Exam  Constitutional: She appears well-developed.  HENT:  Head: Normocephalic.  Neck: Normal range of motion.  Cardiovascular: Normal rate and normal heart sounds.   Respiratory: Effort normal and breath sounds normal.    Prenatal labs: ABO, Rh: A/Positive/-- (05/29 0000) Antibody: Negative (05/29 0000) Rubella: Immune (05/29 0000) RPR: NON REAC (10/07 0000)  HBsAg: Negative (05/29 0000)  HIV: NON REACTIVE (10/07 0000)  GBS:   Positive  Assessment/Plan: Barbara Oconnell is a 32 yo, G2P0010, with GA [redacted]w[redacted]d who presents with ROM 1. Labor: PROM, will start pitocin 2. Fetal Wellbeing: Category I 3. Pain Control: will consider epidural 4. GBS: positive  Plan:  1. Admit to BS  2. Routine L&D orders 3. Analgesia/anesthesia PRN    Governor Specking 12/18/2012, 1:22 AM

## 2012-12-18 NOTE — Progress Notes (Signed)
[facility] cnm called dr Despina Hidden to review fhr tracing

## 2012-12-18 NOTE — Anesthesia Procedure Notes (Signed)

## 2012-12-18 NOTE — Anesthesia Preprocedure Evaluation (Signed)

## 2012-12-18 NOTE — Progress Notes (Signed)
Patient ID: Barbara Oconnell, female   DOB: [birth year], 32 y.o.   MRN: [id number]  FHR reviewed.  Had several decels earlier possibly associated with prolonged UCs. After that had some intermittent late decels.   Pitocin (only got to 41mu/min) turned off. O2 and fluids given.  Variability fair. Now has no decels. Tracing reviewed by DrEure also.   UCs every 2-3 min  Dilation: 5 Effacement (%): 100 Cervical Position: Anterior Station: -1 Presentation: Vertex Exam by:: Artelia Laroche CNM  IUPC and ISE inserted. Will start amnioinfusion.  Will continue to observe.

## 2012-12-18 NOTE — Progress Notes (Signed)
Patient ID: Barbara Oconnell, female   DOB: [birth year], 32 y.o.   MRN: [id number]  FHR reactive  UCs every 2-3 mn  Pt wants epidural Cervix Dilation: 1.5 Effacement (%): 100 Cervical Position: Anterior Station: -2 Presentation: Vertex Exam by:: c soliz rn  Will observe

## 2012-12-18 NOTE — H&P (Signed)
Seen and agree EFW was 6+7 a week ago, my EFW is 7lb Discussed R&B of Pitocin vs expectant management Pt wished to proceed with Pitocin

## 2012-12-18 NOTE — Progress Notes (Signed)
Barbara Oconnell is a 32 y.o. G2P0010 at [redacted]w[redacted]d  Subjective: Comfortable with epidural, though occasionally feeling pain and sharp pressure that "the epidural button" helps. Some mild pressure "most of the time," worse with contractions. Denies headache or change in vision.  Objective: BP 154/91  Pulse 73  Temp 98 F (36.7 C) (Oral)  Resp 18  Ht 5\' 1"  (1.549 m)  Wt 99.338 kg (219 lb)  BMI 41.38 kg/m2  SpO2 100%  LMP 04/04/2012      FHT:  FHR: 130 bpm, variability: moderate,  accelerations:  Present,  decelerations:  Absent UC:   regular, every 3-5 minutes SVE:   Dilation: 7.5 Effacement (%): 90 Station: 0 Exam by:: Dr. Casper Oconnell  Labs: Lab Results  Component Value Date   WBC 8.1 12/18/2012   HGB 11.7* 12/18/2012   HCT 35.0* 12/18/2012   MCV 84.7 12/18/2012   PLT 299 12/18/2012    Assessment / Plan: Induction of labor due to PROM -small amount of cervical change 0600-0800 and 0800-1200 without Pitocin, though contraction pattern not adequate  Labor: Will restart Pitocin, 1x1 and monitor FHR tracing and contractions closely Preeclampsia:  No symptoms but BP's intermittently elevated; will check CBC, CMP, pro/Cr ratio Fetal Wellbeing:  Category I Pain Control:  Epidural I/D:  PCN Anticipated MOD:  NSVD  Barbara Oconnell 12/18/2012, 12:24 PM

## 2012-12-19 NOTE — Anesthesia Postprocedure Evaluation (Signed)
  Anesthesia Post-op Note  Patient: Barbara Oconnell  Procedure(s) Performed: * No procedures listed *  Patient Location: Mother/Baby  Anesthesia Type:Epidural  Level of Consciousness: awake, alert  and oriented  Airway and Oxygen Therapy: Patient Spontanous Breathing  Post-op Pain: mild  Post-op Assessment: Patient's Cardiovascular Status Stable, Respiratory Function Stable, Patent Airway, No signs of Nausea or vomiting and Adequate PO intake  Post-op Vital Signs: stable  Complications: No apparent anesthesia complications

## 2012-12-19 NOTE — Progress Notes (Signed)
Post Partum Day 1 Subjective: no complaints, up ad lib, voiding and tolerating PO, small lochia, plans to breastfeed, undecided  Objective: Blood pressure 109/73, pulse 104, temperature 98.2 F (36.8 C), temperature source Oral, resp. rate 18, height 5\' 1"  (1.549 m), weight 219 lb (99.338 kg), last menstrual period 04/04/2012, SpO2 100.00%, unknown if currently breastfeeding.  Physical Exam:  General: alert, cooperative and no distress Lochia:normal flow Chest: CTAB Heart: RRR no m/r/g Abdomen: +BS, soft, nontender,  large fibroids palpable Uterine Fundus: firm DVT Evaluation: No evidence of DVT seen on physical exam. Extremities: no edema   Basename 12/18/12 1225 12/18/12 0145  HGB 11.6* 11.7*  HCT 35.3* 35.0*    Assessment/Plan: Plan for discharge tomorrow, Breastfeeding and Lactation consult   LOS: 1 day   CRESENZO-DISHMAN,Abigal Choung 12/19/2012, 8:00 AM

## 2012-12-19 NOTE — Progress Notes (Signed)
UR completed 

## 2012-12-20 ENCOUNTER — Encounter (HOSPITAL_COMMUNITY): Payer: Self-pay | Admitting: *Deleted

## 2012-12-20 MED ORDER — IBUPROFEN 600 MG PO TABS: 600.0000 mg | ORAL_TABLET | Freq: Four times a day (QID) | ORAL | Status: DC

## 2012-12-20 NOTE — Discharge Summary (Signed)
Obstetric Discharge Summary Reason for Admission: rupture of membranes, no labor Prenatal Procedures: none Intrapartum Procedures: spontaneous vaginal delivery Postpartum Procedures: none Complications-Operative and Postpartum: 2nd degree perineal laceration Hemoglobin  Date Value Range Status  12/18/2012 11.6* 12.0 - 15.0 g/dL Final  0/86/5784 9.5   Final     HCT  Date Value Range Status  12/18/2012 35.3* 36.0 - 46.0 % Final  05/28/2012 30   Final    Physical Exam:  General: alert, cooperative and no distress Lochia: appropriate Uterine Fundus: firm DVT Evaluation: No evidence of DVT seen on physical exam.  Discharge Diagnoses: Term Pregnancy-delivered  Discharge Information: Date: 12/20/2012 Activity: pelvic rest Diet: routine Medications: PNV and Ibuprofen Condition: stable Instructions: refer to practice specific booklet Discharge to: home Follow-up Information    Follow up with [facility]. (Make an appointment for 4-6 weeks postpartum)    Contact information:   [address] [place] [address]          Newborn Data: Live born female  Birth Weight: 6 lb 15 oz (3147 g) APGAR: 8, 9  Home with mother. Breastfeeding going well. Condoms for contraception.  Barbara Oconnell 12/20/2012, 7:31 AM

## 2012-12-21 LAB — TYPE AND SCREEN
ABO/RH(D): A POS
Antibody Screen: NEGATIVE
Unit division: 0

## 2013-01-23 ENCOUNTER — Ambulatory Visit: Payer: Medicaid Other | Admitting: Obstetrics & Gynecology

## 2013-01-26 ENCOUNTER — Encounter: Payer: Self-pay | Admitting: Obstetrics and Gynecology

## 2013-01-26 ENCOUNTER — Ambulatory Visit: Payer: Medicaid Other | Admitting: Obstetrics and Gynecology

## 2013-01-26 MED ORDER — NORETHINDRONE 0.35 MG PO TABS: 1.0000 | ORAL_TABLET | Freq: Every day | ORAL | Status: DC

## 2013-01-26 NOTE — Progress Notes (Unsigned)
  Subjective:     Barbara Oconnell is a 33 y.o. female G2P1011 who presents for a postpartum visit. She is 5 weeks postpartum following a spontaneous vaginal delivery. I have fully reviewed the prenatal and intrapartum course. The delivery was at 37.3 gestational weeks. Outcome: spontaneous vaginal delivery. Anesthesia: epidural. Postpartum course has been uncomplicated. Baby's course has been uncomplicated. Baby is feeding by breast. Bleeding staining only. Bowel function is normal. Bladder function is normal. Patient is not sexually active. Contraception method is abstinence. Not with FOB though he helps some with baby. WOuld like OCPs in case needed later. Postpartum depression screening: negative.  The following portions of the patient's history were reviewed and updated as appropriate: allergies, current medications, past family history, past medical history, past social history, past surgical history and problem list.  Review of Systems Pertinent items are noted in HPI.  Fatigued -not much help with baby and doing online classes. Plans return to work in 2 wks.  Objective:    BP 118/78  Pulse 76  Temp 98.7 F (37.1 C) (Oral)  Wt 193 lb 4.8 oz (87.68 kg)  Breastfeeding? Yes  General:  alert, cooperative, appears stated age and no distress   Breasts:  inspection negative, no nipple discharge or bleeding, no masses or nodularity palpable Thyroid not enlarged  Lungs: clear to auscultation bilaterally  Heart:  regular rate and rhythm, S1, S2 normal, no murmur, click, rub or gallop  Abdomen: soft, non-tender; bowel sounds normal; no masses,  no organomegaly   Vulva:  normal  Vagina: normal vagina  Cervix:  no cervical motion tenderness  Corpus: enlarged c/w fibroids 10-12 wk size  Adnexa:  normal adnexa  Rectal Exam: Not performed.        Assessment:    5 wks postpartum exam. Pap smear not done at today's visit.   Plan:    1. Contraception: oral progesterone-only contraceptive 2.  Exercise and sleep enhancement discussed 3. Follow up in: 6 months or as needed.

## 2013-01-26 NOTE — Patient Instructions (Addendum)
Place postpartum visit patient instructions here. Fatigue Fatigue is a feeling of tiredness, lack of energy, lack of motivation, or feeling tired all the time. Having enough rest, good nutrition, and reducing stress will normally reduce fatigue. Consult your caregiver if it persists. The nature of your fatigue will help your caregiver to find out its cause. The treatment is based on the cause.  CAUSES  There are many causes for fatigue. Most of the time, fatigue can be traced to one or more of your habits or routines. Most causes fit into one or more of three general areas. They are: Lifestyle problems  Sleep disturbances.  Overwork.  Physical exertion.  Unhealthy habits.  Poor eating habits or eating disorders.  Alcohol and/or drug use .  Lack of proper nutrition (malnutrition). Psychological problems  Stress and/or anxiety problems.  Depression.  Grief.  Boredom. Medical Problems or Conditions  Anemia.  Pregnancy.  Thyroid gland problems.  Recovery from major surgery.  Continuous pain.  Emphysema or asthma that is not well controlled  Allergic conditions.  Diabetes.  Infections (such as mononucleosis).  Obesity.  Sleep disorders, such as sleep apnea.  Heart failure or other heart-related problems.  Cancer.  Kidney disease.  Liver disease.  Effects of certain medicines such as antihistamines, cough and cold remedies, prescription pain medicines, heart and blood pressure medicines, drugs used for treatment of cancer, and some antidepressants. SYMPTOMS  The symptoms of fatigue include:   Lack of energy.  Lack of drive (motivation).  Drowsiness.  Feeling of indifference to the surroundings. DIAGNOSIS  The details of how you feel help guide your caregiver in finding out what is causing the fatigue. You will be asked about your present and past health condition. It is important to review all medicines that you take, including prescription and  non-prescription items. A thorough exam will be done. You will be questioned about your feelings, habits, and normal lifestyle. Your caregiver may suggest blood tests, urine tests, or other tests to look for common medical causes of fatigue.  TREATMENT  Fatigue is treated by correcting the underlying cause. For example, if you have continuous pain or depression, treating these causes will improve how you feel. Similarly, adjusting the dose of certain medicines will help in reducing fatigue.  HOME CARE INSTRUCTIONS   Try to get the required amount of good sleep every night.  Eat a healthy and nutritious diet, and drink enough water throughout the day.  Practice ways of relaxing (including yoga or meditation).  Exercise regularly.  Make plans to change situations that cause stress. Act on those plans so that stresses decrease over time. Keep your work and personal routine reasonable.  Avoid street drugs and minimize use of alcohol.  Start taking a daily multivitamin after consulting your caregiver. SEEK MEDICAL CARE IF:   You have persistent tiredness, which cannot be accounted for.  You have fever.  You have unintentional weight loss.  You have headaches.  You have disturbed sleep throughout the night.  You are feeling sad.  You have constipation.  You have dry skin.  You have gained weight.  You are taking any new or different medicines that you suspect are causing fatigue.  You are unable to sleep at night.  You develop any unusual swelling of your legs or other parts of your body. SEEK IMMEDIATE MEDICAL CARE IF:   You are feeling confused.  Your vision is blurred.  You feel faint or pass out.  You develop severe headache.  You develop severe abdominal, pelvic, or back pain.  You develop chest pain, shortness of breath, or an irregular or fast heartbeat.  You are unable to pass a normal amount of urine.  You develop abnormal bleeding such as bleeding from  the rectum or you vomit blood.  You have thoughts about harming yourself or committing suicide.  You are worried that you might harm someone else. MAKE SURE YOU:   Understand these instructions.  Will watch your condition.  Will get help right away if you are not doing well or get worse. Document Released: 10/14/2007 Document Revised: 03/10/2012 Document Reviewed: 10/14/2007 [facility] Patient Information 2013 [place], [place]. Oral Contraception Information Oral contraceptives (OCs) are medicines taken to prevent pregnancy. OCs work by preventing the ovaries from releasing eggs. The hormones in OCs also cause the cervical mucus to thicken, preventing the sperm from entering the uterus. The hormones also cause the uterine lining to become thin, not allowing a fertilized egg to attach to the inside of the uterus. OCs are highly effective when taken exactly as prescribed. However, OCs do not prevent sexually transmitted diseases (STDs). Safe sex practices, such as using condoms along with the pill, can help prevent STDs.  Before taking the pill, you may have a physical exam and Pap test. Your caregiver may order blood tests that may be necessary. Your caregiver will make sure you are a good candidate for oral contraception. Discuss with your caregiver the possible side effects of the OC you may be prescribed. When starting an OC, it can take 2 to 3 months for the body to adjust to the changes in hormone levels in your body.  TYPES OF ORAL CONTRACEPTION  The combination pill. This pill contains estrogen and progestin (synthetic progesterone) hormones. The combination pill comes in either 21-day or 28-day packs. With 21-day packs, you do not take pills for 7 days after the last pill. With 28-day packs, the pill is taken every day. The last 7 pills are without hormones. Certain types of pills have more than 21 hormone-containing pills.  The minipill. This pill contains the progesterone hormone only. It  is taken every day continuously. The minipill comes in packs of 91 pills. The first 84 pills contain the hormones, and the last 7 pills do not. The last 7 days are when you will have your menstrual period. You may experience irregular spotting. ADVANTAGES  Decreases premenstrual symptoms.  Treats menstrual period cramps.  Regulates the menstrual cycle.  Decreases a heavy menstrual flow.  Treats acne.  Treats abnormal uterine bleeding.  Treats chronic pelvic pain.  Treats polycystic ovarian syndrome.  Treats endometriosis.  Can be used as emergency contraception. DISADVANTAGES OCs can be less effective if:  You forget to take the pill at the same time every day.  You have a stomach or intestinal disease that lessens the absorption of the pill.  You take OCs with other medicines that make OCs less effective.  You take expired OCs.  You forget to restart the pill on day 7, when using the packs of 21 pills. Document Released: 03/09/2003 Document Revised: 03/10/2012 Document Reviewed: 04/25/2011 [facility] Patient Information 2013 [place], [place].

## 2013-01-28 ENCOUNTER — Telehealth: Payer: Self-pay | Admitting: *Deleted

## 2013-01-28 NOTE — Telephone Encounter (Signed)
Received a call from a [employer] employee Barbara Oconnell stating they are requesting an accomodation for Barbara Oconnell to be out for maternity leave thru Feb. 15. States she faxed a form yesterday and needs a doctor to sign it and state the medical reason Barbara Oconnell needs to be out. States Barbara Oconnell is new to the company and is not eligible for FMLA or Short term disability and they want to make the appropriate accomodations. States since she delivered 12/18/12 and they were able to approve 6 weeks and her come back 02/02/13 , but if she needs to be out until 02/14/13 need a medical reason . Request a call to [phone] or form faxed back to [phone]

## 2013-01-29 ENCOUNTER — Encounter: Payer: Self-pay | Admitting: *Deleted

## 2013-01-29 ENCOUNTER — Telehealth: Payer: Self-pay | Admitting: *Deleted

## 2013-01-29 DIAGNOSIS — IMO0001 Reserved for inherently not codable concepts without codable children: Secondary | ICD-10-CM

## 2013-01-29 MED ORDER — NORETHINDRONE 0.35 MG PO TABS: 1.0000 | ORAL_TABLET | Freq: Every day | ORAL | Status: DC

## 2013-01-29 NOTE — Telephone Encounter (Signed)
Pt called and requested that her birth control be sent in as a 90 day rx instead of 30 day.

## 2013-01-29 NOTE — Telephone Encounter (Signed)
Reviewed patients chart and there is no medical reason that patient needs to stay out of work. I spoke with Uliana and she confirmed this. I then called Andrey Cota at [employer] and informed her of this information. She requested that I send a letter to her stating that patient can return to work with no restrictions as of 01/30/13.

## 2013-02-20 IMAGING — US US OB COMP LESS 14 WK
1 series · 13 of 28 positions shown · non-contrast
Comparison: none

[Series 1: us ob comp less 14 wks · 13 of 53 slices shown]
[im 2/53]
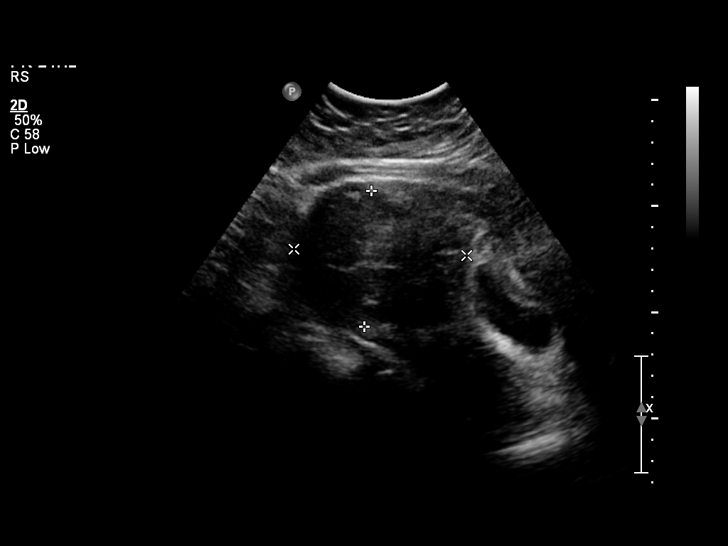
[im 6/53]
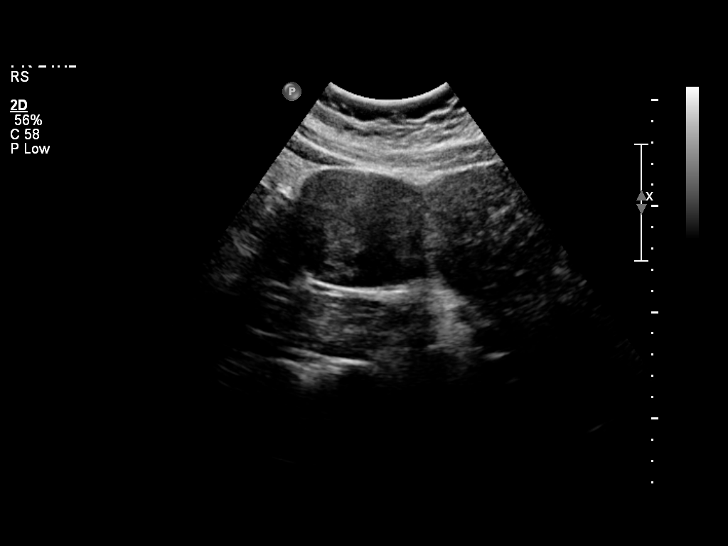
[im 10/53]
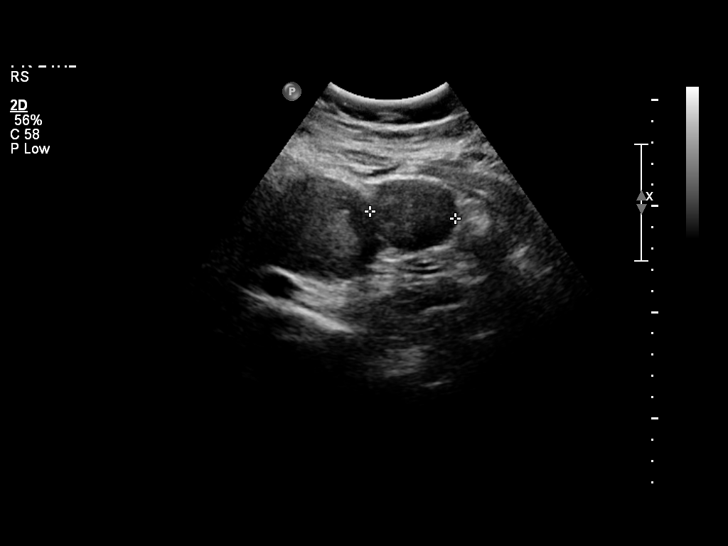
[im 14/53]
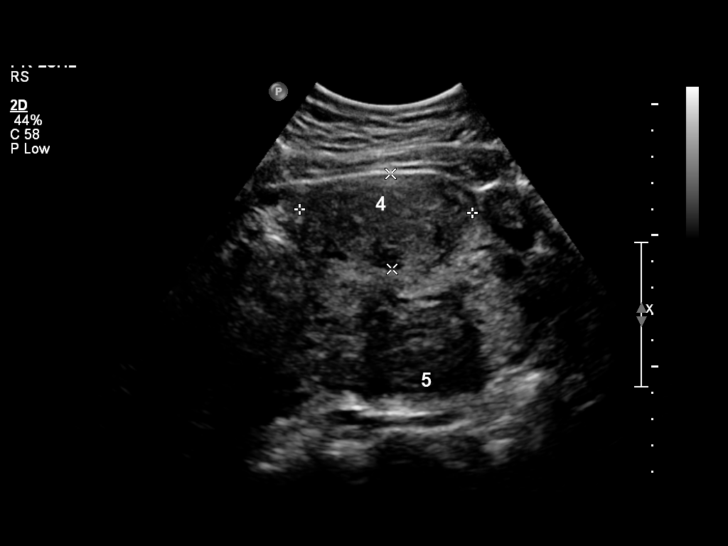
[im 18/53]
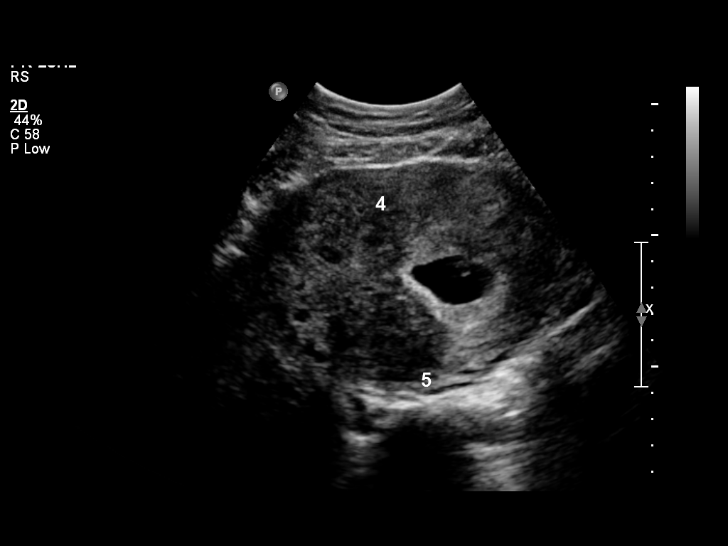
[im 22/53]
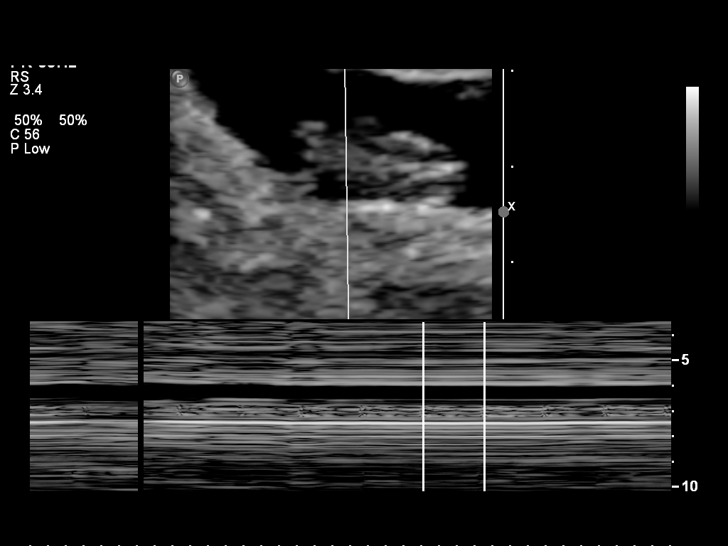
[im 27/53]
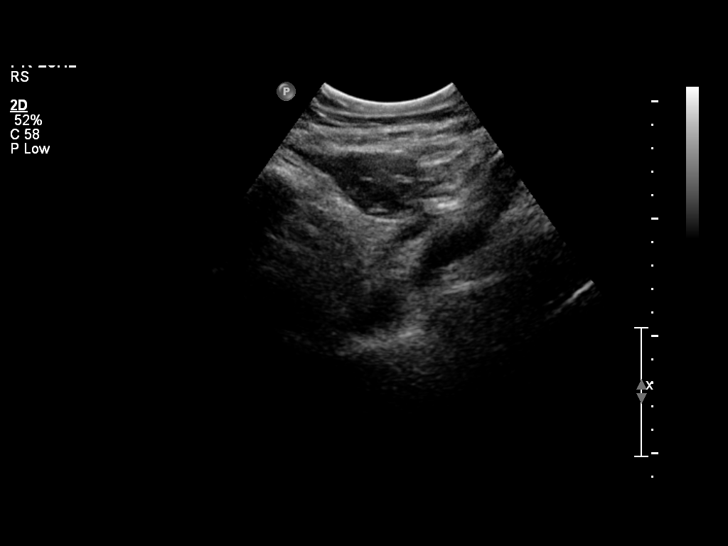
[im 31/53]
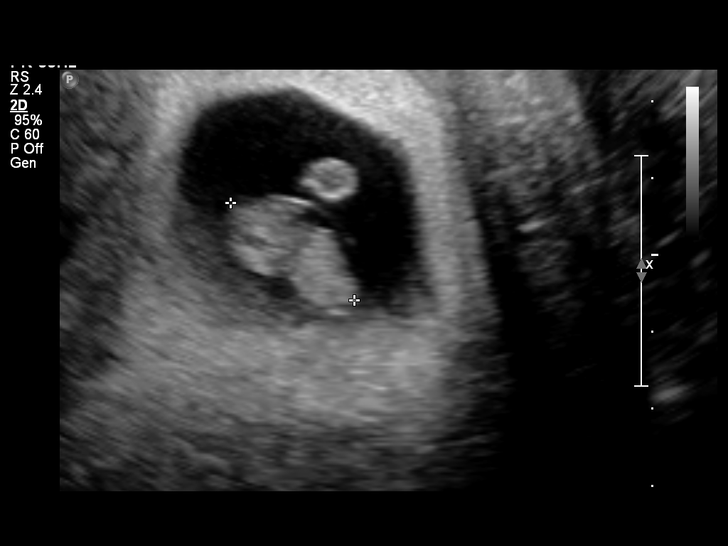
[im 35/53]
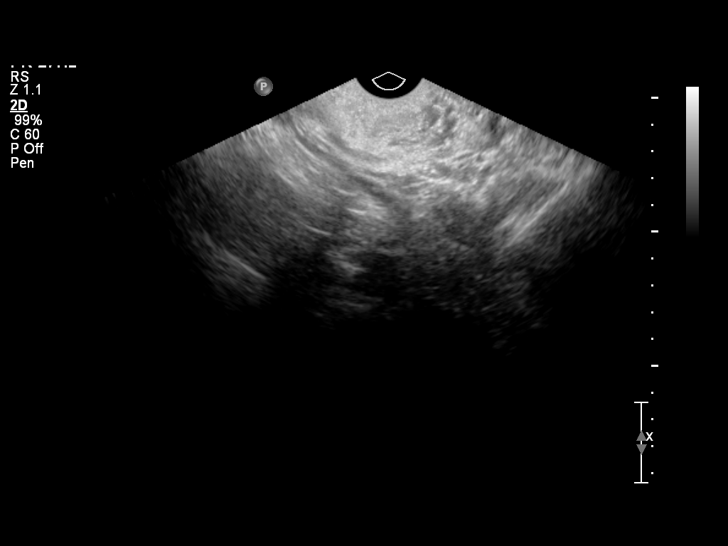
[im 39/53]
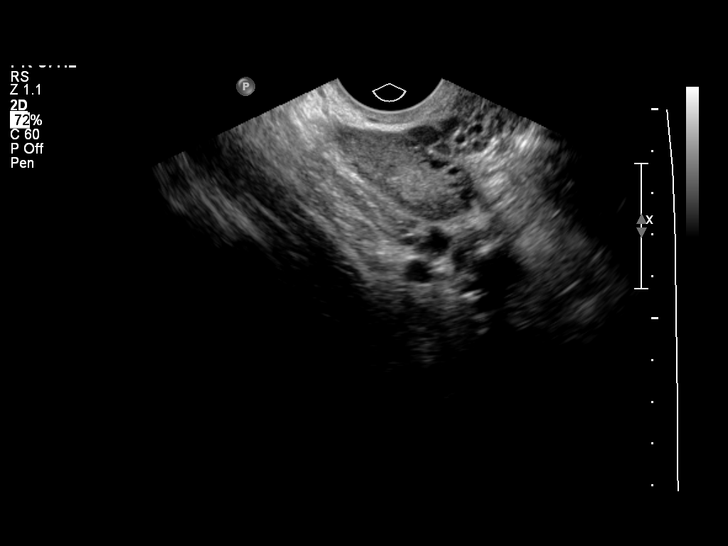
[im 43/53]
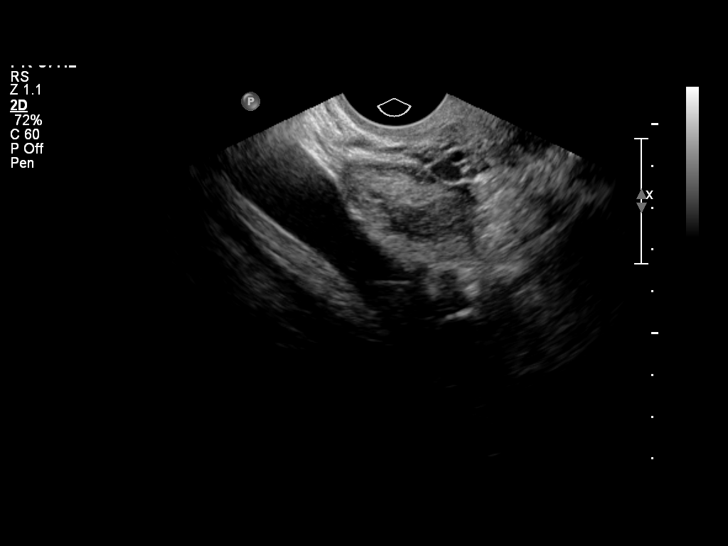
[im 47/53]
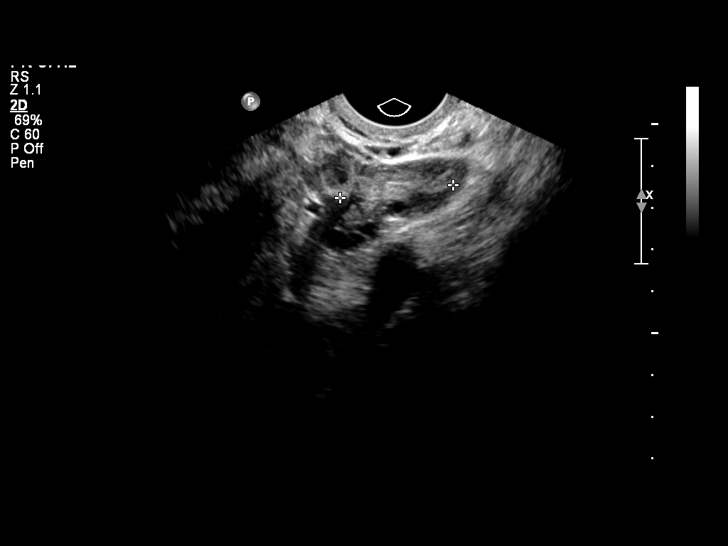
[im 51/53]
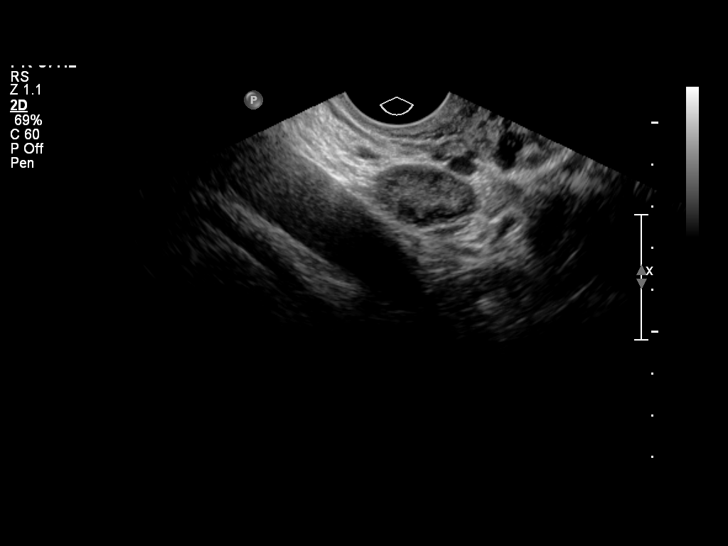

[13 of 28 positions shown; findings below may reference images not displayed]

OBSTETRICS REPORT
                      (Signed Final 05/30/2012 [DATE])

 Order#:         [id number]
                 89_O
Procedures

 US OB COMP LESS 14 WKS                                76801.0
 US OB TRANSVAGINAL                                    76817.0
Indications

 Unsure of LMP;  Establish Gestational [AGE]
 Pregnancy with inconclusive fetal viability
 Uterine fibroids
Fetal Evaluation

 Gest. Sac:         Intrauterine
 Yolk Sac:          Visualized
 Fetal Pole:        Visualized
 Fetal Heart Rate:  172                         bpm
 Cardiac Activity:  Observed

 Amniotic Fluid
 AFI FV:      Subjectively within normal limits
Biometry

 CRL:     20.5  mm    G. Age:   8w 4d                  EDD:   01/05/13
Gestational Age

 Best:          8w 4d     Det. By:   U/S C R L (05/30/12)     EDD:   01/05/13
Cervix Uterus Adnexa

 Cervix:       Closed.
 Uterus:       Multiple fibroids noted, see table below.
 Left Ovary:   Within normal limits.1.NQT.AQS.Acm
 Right Ovary:  Within normal limits. E.MPM.MPE.Ecm

 Adnexa:     No free fluid.
Myomas

 Site                     L(cm)      W(cm)      D(cm)       Location
 Fundus                   8.1        8.2        6.4         Intramural
 Site                     L(cm)      W(cm)      D(cm)       Location
 Fundus Left              5.6        6.1        5.9         Pedunculated
 Fundus Left              4.8        4          3.7         Pedunculated
 Anterior LUS             6.6        5.6        3.6         Intramural
 Posterior LUS            4.5        4.7        3.7         Intramural
 Blood Flow                  RI       PI       Comments

Impression

 There is a single living intrauterine pregancy demonstrating
 an EGA by CRL of  8w 4d.

 Fibroid uterus with fibroid sizes and locations as noted above.

 Normal ovaries.

## 2013-03-23 ENCOUNTER — Telehealth: Payer: Self-pay | Admitting: General Practice

## 2013-03-23 MED ORDER — PRENATAL MULTIVITAMIN CH: 1.0000 | ORAL_TABLET | Freq: Every day | ORAL | Status: DC

## 2013-03-23 NOTE — Telephone Encounter (Signed)
Patient called and left message stating she would like a Rx for PNV because she does not have any more and would like a 90 day supply if possible and if she doesn't answer we can leave a message on her voicemail because she is at work. Got Rx renewal and 2 refills from Dr Jolayne Panther. Called patient, no answer- left message informing patient of Rx and two refills available at [facility] on [address] and to call back if she has any questions or concerns

## 2014-09-22 ENCOUNTER — Ambulatory Visit (INDEPENDENT_AMBULATORY_CARE_PROVIDER_SITE_OTHER): Payer: 59 | Admitting: Obstetrics & Gynecology

## 2014-09-22 VITALS — BP 105/57 | HR 72 | Ht 61.0 in | Wt 260.3 lb

## 2014-09-22 DIAGNOSIS — D251 Intramural leiomyoma of uterus: Secondary | ICD-10-CM

## 2014-09-22 DIAGNOSIS — Z01419 Encounter for gynecological examination (general) (routine) without abnormal findings: Secondary | ICD-10-CM

## 2014-09-22 NOTE — Patient Instructions (Signed)
Preventive Care for Adults A healthy lifestyle and preventive care can promote health and wellness. Preventive health guidelines for women include the following key practices.  A routine yearly physical is a good way to check with your health care provider about your health and preventive screening. It is a chance to share any concerns and updates on your health and to receive a thorough exam.  Visit your dentist for a routine exam and preventive care every 6 months. Brush your teeth twice a day and floss once a day. Good oral hygiene prevents tooth decay and gum disease.  The frequency of eye exams is based on your age, health, family medical history, use of contact lenses, and other factors. Follow your health care provider's recommendations for frequency of eye exams.  Eat a healthy diet. Foods like vegetables, fruits, whole grains, low-fat dairy products, and lean protein foods contain the nutrients you need without too many calories. Decrease your intake of foods high in solid fats, added sugars, and salt. Eat the right amount of calories for you.Get information about a proper diet from your health care provider, if necessary.  Regular physical exercise is one of the most important things you can do for your health. Most adults should get at least 150 minutes of moderate-intensity exercise (any activity that increases your heart rate and causes you to sweat) each week. In addition, most adults need muscle-strengthening exercises on 2 or more days a week.  Maintain a healthy weight. The body mass index (BMI) is a screening tool to identify possible weight problems. It provides an estimate of body fat based on height and weight. Your health care provider can find your BMI and can help you achieve or maintain a healthy weight.For adults 20 years and older:  A BMI below 18.5 is considered underweight.  A BMI of 18.5 to 24.9 is normal.  A BMI of 25 to 29.9 is considered overweight.  A BMI of  30 and above is considered obese.  Maintain normal blood lipids and cholesterol levels by exercising and minimizing your intake of saturated fat. Eat a balanced diet with plenty of fruit and vegetables. Blood tests for lipids and cholesterol should begin at age 76 and be repeated every 5 years. If your lipid or cholesterol levels are high, you are over 50, or you are at high risk for heart disease, you may need your cholesterol levels checked more frequently.Ongoing high lipid and cholesterol levels should be treated with medicines if diet and exercise are not working.  If you smoke, find out from your health care provider how to quit. If you do not use tobacco, do not start.  Lung cancer screening is recommended for adults aged 22-80 years who are at high risk for developing lung cancer because of a history of smoking. A yearly low-dose CT scan of the lungs is recommended for people who have at least a 30-pack-year history of smoking and are a current smoker or have quit within the past 15 years. A pack year of smoking is smoking an average of 1 pack of cigarettes a day for 1 year (for example: 1 pack a day for 30 years or 2 packs a day for 15 years). Yearly screening should continue until the smoker has stopped smoking for at least 15 years. Yearly screening should be stopped for people who develop a health problem that would prevent them from having lung cancer treatment.  If you are pregnant, do not drink alcohol. If you are breastfeeding,  be very cautious about drinking alcohol. If you are not pregnant and choose to drink alcohol, do not have more than 1 drink per day. One drink is considered to be 12 ounces (355 mL) of beer, 5 ounces (148 mL) of wine, or 1.5 ounces (44 mL) of liquor.  Avoid use of street drugs. Do not share needles with anyone. Ask for help if you need support or instructions about stopping the use of drugs.  High blood pressure causes heart disease and increases the risk of  stroke. Your blood pressure should be checked at least every 1 to 2 years. Ongoing high blood pressure should be treated with medicines if weight loss and exercise do not work.  If you are 3-86 years old, ask your health care provider if you should take aspirin to prevent strokes.  Diabetes screening involves taking a blood sample to check your fasting blood sugar level. This should be done once every 3 years, after age 67, if you are within normal weight and without risk factors for diabetes. Testing should be considered at a younger age or be carried out more frequently if you are overweight and have at least 1 risk factor for diabetes.  Breast cancer screening is essential preventive care for women. You should practice "breast self-awareness." This means understanding the normal appearance and feel of your breasts and may include breast self-examination. Any changes detected, no matter how small, should be reported to a health care provider. Women in their 8s and 30s should have a clinical breast exam (CBE) by a health care provider as part of a regular health exam every 1 to 3 years. After age 70, women should have a CBE every year. Starting at age 25, women should consider having a mammogram (breast X-ray test) every year. Women who have a family history of breast cancer should talk to their health care provider about genetic screening. Women at a high risk of breast cancer should talk to their health care providers about having an MRI and a mammogram every year.  Breast cancer gene (BRCA)-related cancer risk assessment is recommended for women who have family members with BRCA-related cancers. BRCA-related cancers include breast, ovarian, tubal, and peritoneal cancers. Having family members with these cancers may be associated with an increased risk for harmful changes (mutations) in the breast cancer genes BRCA1 and BRCA2. Results of the assessment will determine the need for genetic counseling and  BRCA1 and BRCA2 testing.  Routine pelvic exams to screen for cancer are no longer recommended for nonpregnant women who are considered low risk for cancer of the pelvic organs (ovaries, uterus, and vagina) and who do not have symptoms. Ask your health care provider if a screening pelvic exam is right for you.  If you have had past treatment for cervical cancer or a condition that could lead to cancer, you need Pap tests and screening for cancer for at least 20 years after your treatment. If Pap tests have been discontinued, your risk factors (such as having a new sexual partner) need to be reassessed to determine if screening should be resumed. Some women have medical problems that increase the chance of getting cervical cancer. In these cases, your health care provider may recommend more frequent screening and Pap tests.  The HPV test is an additional test that may be used for cervical cancer screening. The HPV test looks for the virus that can cause the cell changes on the cervix. The cells collected during the Pap test can be  tested for HPV. The HPV test could be used to screen women aged 30 years and older, and should be used in women of any age who have unclear Pap test results. After the age of 30, women should have HPV testing at the same frequency as a Pap test.  Colorectal cancer can be detected and often prevented. Most routine colorectal cancer screening begins at the age of 50 years and continues through age 75 years. However, your health care provider may recommend screening at an earlier age if you have risk factors for colon cancer. On a yearly basis, your health care provider may provide home test kits to check for hidden blood in the stool. Use of a small camera at the end of a tube, to directly examine the colon (sigmoidoscopy or colonoscopy), can detect the earliest forms of colorectal cancer. Talk to your health care provider about this at age 50, when routine screening begins. Direct  exam of the colon should be repeated every 5-10 years through age 75 years, unless early forms of pre-cancerous polyps or small growths are found.  People who are at an increased risk for hepatitis B should be screened for this virus. You are considered at high risk for hepatitis B if:  You were born in a country where hepatitis B occurs often. Talk with your health care provider about which countries are considered high risk.  Your parents were born in a high-risk country and you have not received a shot to protect against hepatitis B (hepatitis B vaccine).  You have HIV or AIDS.  You use needles to inject street drugs.  You live with, or have sex with, someone who has hepatitis B.  You get hemodialysis treatment.  You take certain medicines for conditions like cancer, organ transplantation, and autoimmune conditions.  Hepatitis C blood testing is recommended for all people born from 1945 through 1965 and any individual with known risks for hepatitis C.  Practice safe sex. Use condoms and avoid high-risk sexual practices to reduce the spread of sexually transmitted infections (STIs). STIs include gonorrhea, chlamydia, syphilis, trichomonas, herpes, HPV, and human immunodeficiency virus (HIV). Herpes, HIV, and HPV are viral illnesses that have no cure. They can result in disability, cancer, and death.  You should be screened for sexually transmitted illnesses (STIs) including gonorrhea and chlamydia if:  You are sexually active and are younger than 24 years.  You are older than 24 years and your health care provider tells you that you are at risk for this type of infection.  Your sexual activity has changed since you were last screened and you are at an increased risk for chlamydia or gonorrhea. Ask your health care provider if you are at risk.  If you are at risk of being infected with HIV, it is recommended that you take a prescription medicine daily to prevent HIV infection. This is  called preexposure prophylaxis (PrEP). You are considered at risk if:  You are a heterosexual woman, are sexually active, and are at increased risk for HIV infection.  You take drugs by injection.  You are sexually active with a partner who has HIV.  Talk with your health care provider about whether you are at high risk of being infected with HIV. If you choose to begin PrEP, you should first be tested for HIV. You should then be tested every 3 months for as long as you are taking PrEP.  Osteoporosis is a disease in which the bones lose minerals and strength   with aging. This can result in serious bone fractures or breaks. The risk of osteoporosis can be identified using a bone density scan. Women ages 65 years and over and women at risk for fractures or osteoporosis should discuss screening with their health care providers. Ask your health care provider whether you should take a calcium supplement or vitamin D to reduce the rate of osteoporosis.  Menopause can be associated with physical symptoms and risks. Hormone replacement therapy is available to decrease symptoms and risks. You should talk to your health care provider about whether hormone replacement therapy is right for you.  Use sunscreen. Apply sunscreen liberally and repeatedly throughout the day. You should seek shade when your shadow is shorter than you. Protect yourself by wearing long sleeves, pants, a wide-brimmed hat, and sunglasses year round, whenever you are outdoors.  Once a month, do a whole body skin exam, using a mirror to look at the skin on your back. Tell your health care provider of new moles, moles that have irregular borders, moles that are larger than a pencil eraser, or moles that have changed in shape or color.  Stay current with required vaccines (immunizations).  Influenza vaccine. All adults should be immunized every year.  Tetanus, diphtheria, and acellular pertussis (Td, Tdap) vaccine. Pregnant women should  receive 1 dose of Tdap vaccine during each pregnancy. The dose should be obtained regardless of the length of time since the last dose. Immunization is preferred during the 27th-36th week of gestation. An adult who has not previously received Tdap or who does not know her vaccine status should receive 1 dose of Tdap. This initial dose should be followed by tetanus and diphtheria toxoids (Td) booster doses every 10 years. Adults with an unknown or incomplete history of completing a 3-dose immunization series with Td-containing vaccines should begin or complete a primary immunization series including a Tdap dose. Adults should receive a Td booster every 10 years.  Varicella vaccine. An adult without evidence of immunity to varicella should receive 2 doses or a second dose if she has previously received 1 dose. Pregnant females who do not have evidence of immunity should receive the first dose after pregnancy. This first dose should be obtained before leaving the health care facility. The second dose should be obtained 4-8 weeks after the first dose.  Human papillomavirus (HPV) vaccine. Females aged 13-26 years who have not received the vaccine previously should obtain the 3-dose series. The vaccine is not recommended for use in pregnant females. However, pregnancy testing is not needed before receiving a dose. If a female is found to be pregnant after receiving a dose, no treatment is needed. In that case, the remaining doses should be delayed until after the pregnancy. Immunization is recommended for any person with an immunocompromised condition through the age of 26 years if she did not get any or all doses earlier. During the 3-dose series, the second dose should be obtained 4-8 weeks after the first dose. The third dose should be obtained 24 weeks after the first dose and 16 weeks after the second dose.  Zoster vaccine. One dose is recommended for adults aged 60 years or older unless certain conditions are  present.  Measles, mumps, and rubella (MMR) vaccine. Adults born before 1957 generally are considered immune to measles and mumps. Adults born in 1957 or later should have 1 or more doses of MMR vaccine unless there is a contraindication to the vaccine or there is laboratory evidence of immunity to   each of the three diseases. A routine second dose of MMR vaccine should be obtained at least 28 days after the first dose for students attending postsecondary schools, health care workers, or international travelers. People who received inactivated measles vaccine or an unknown type of measles vaccine during 1963-1967 should receive 2 doses of MMR vaccine. People who received inactivated mumps vaccine or an unknown type of mumps vaccine before 1979 and are at high risk for mumps infection should consider immunization with 2 doses of MMR vaccine. For females of childbearing age, rubella immunity should be determined. If there is no evidence of immunity, females who are not pregnant should be vaccinated. If there is no evidence of immunity, females who are pregnant should delay immunization until after pregnancy. Unvaccinated health care workers born before 1957 who lack laboratory evidence of measles, mumps, or rubella immunity or laboratory confirmation of disease should consider measles and mumps immunization with 2 doses of MMR vaccine or rubella immunization with 1 dose of MMR vaccine.  Pneumococcal 13-valent conjugate (PCV13) vaccine. When indicated, a person who is uncertain of her immunization history and has no record of immunization should receive the PCV13 vaccine. An adult aged 19 years or older who has certain medical conditions and has not been previously immunized should receive 1 dose of PCV13 vaccine. This PCV13 should be followed with a dose of pneumococcal polysaccharide (PPSV23) vaccine. The PPSV23 vaccine dose should be obtained at least 8 weeks after the dose of PCV13 vaccine. An adult aged 19  years or older who has certain medical conditions and previously received 1 or more doses of PPSV23 vaccine should receive 1 dose of PCV13. The PCV13 vaccine dose should be obtained 1 or more years after the last PPSV23 vaccine dose.  Pneumococcal polysaccharide (PPSV23) vaccine. When PCV13 is also indicated, PCV13 should be obtained first. All adults aged 65 years and older should be immunized. An adult younger than age 65 years who has certain medical conditions should be immunized. Any person who resides in a nursing home or long-term care facility should be immunized. An adult smoker should be immunized. People with an immunocompromised condition and certain other conditions should receive both PCV13 and PPSV23 vaccines. People with human immunodeficiency virus (HIV) infection should be immunized as soon as possible after diagnosis. Immunization during chemotherapy or radiation therapy should be avoided. Routine use of PPSV23 vaccine is not recommended for American Indians, [place] Natives, or people younger than 65 years unless there are medical conditions that require PPSV23 vaccine. When indicated, people who have unknown immunization and have no record of immunization should receive PPSV23 vaccine. One-time revaccination 5 years after the first dose of PPSV23 is recommended for people aged 19-64 years who have chronic kidney failure, nephrotic syndrome, asplenia, or immunocompromised conditions. People who received 1-2 doses of PPSV23 before age 65 years should receive another dose of PPSV23 vaccine at age 65 years or later if at least 5 years have passed since the previous dose. Doses of PPSV23 are not needed for people immunized with PPSV23 at or after age 65 years.  Meningococcal vaccine. Adults with asplenia or persistent complement component deficiencies should receive 2 doses of quadrivalent meningococcal conjugate (MenACWY-D) vaccine. The doses should be obtained at least 2 months apart.  Microbiologists working with certain meningococcal bacteria, military recruits, people at risk during an outbreak, and people who travel to or live in countries with a high rate of meningitis should be immunized. A first-year college student up through age   21 years who is living in a residence hall should receive a dose if she did not receive a dose on or after her 16th birthday. Adults who have certain high-risk conditions should receive one or more doses of vaccine.  Hepatitis A vaccine. Adults who wish to be protected from this disease, have certain high-risk conditions, work with hepatitis A-infected animals, work in hepatitis A research labs, or travel to or work in countries with a high rate of hepatitis A should be immunized. Adults who were previously unvaccinated and who anticipate close contact with an international adoptee during the first 60 days after arrival in the [place] from a country with a high rate of hepatitis A should be immunized.  Hepatitis B vaccine. Adults who wish to be protected from this disease, have certain high-risk conditions, may be exposed to blood or other infectious body fluids, are household contacts or sex partners of hepatitis B positive people, are clients or workers in certain care facilities, or travel to or work in countries with a high rate of hepatitis B should be immunized.  Haemophilus influenzae type b (Hib) vaccine. A previously unvaccinated person with asplenia or sickle cell disease or having a scheduled splenectomy should receive 1 dose of Hib vaccine. Regardless of previous immunization, a recipient of a hematopoietic stem cell transplant should receive a 3-dose series 6-12 months after her successful transplant. Hib vaccine is not recommended for adults with HIV infection. Preventive Services / Frequency Ages 64 to 68 years  Blood pressure check.** / Every 1 to 2 years.  Lipid and cholesterol check.** / Every 5 years beginning at age  22.  Clinical breast exam.** / Every 3 years for women in their 88s and 53s.  BRCA-related cancer risk assessment.** / For women who have family members with a BRCA-related cancer (breast, ovarian, tubal, or peritoneal cancers).  Pap test.** / Every 2 years from ages 90 through 51. Every 3 years starting at age 21 through age 56 or 3 with a history of 3 consecutive normal Pap tests.  HPV screening.** / Every 3 years from ages 24 through ages 1 to 46 with a history of 3 consecutive normal Pap tests.  Hepatitis C blood test.** / For any individual with known risks for hepatitis C.  Skin self-exam. / Monthly.  Influenza vaccine. / Every year.  Tetanus, diphtheria, and acellular pertussis (Tdap, Td) vaccine.** / Consult your health care provider. Pregnant women should receive 1 dose of Tdap vaccine during each pregnancy. 1 dose of Td every 10 years.  Varicella vaccine.** / Consult your health care provider. Pregnant females who do not have evidence of immunity should receive the first dose after pregnancy.  HPV vaccine. / 3 doses over 6 months, if 72 and younger. The vaccine is not recommended for use in pregnant females. However, pregnancy testing is not needed before receiving a dose.  Measles, mumps, rubella (MMR) vaccine.** / You need at least 1 dose of MMR if you were born in 1957 or later. You may also need a 2nd dose. For females of childbearing age, rubella immunity should be determined. If there is no evidence of immunity, females who are not pregnant should be vaccinated. If there is no evidence of immunity, females who are pregnant should delay immunization until after pregnancy.  Pneumococcal 13-valent conjugate (PCV13) vaccine.** / Consult your health care provider.  Pneumococcal polysaccharide (PPSV23) vaccine.** / 1 to 2 doses if you smoke cigarettes or if you have certain conditions.  Meningococcal vaccine.** /  1 dose if you are age 19 to 21 years and a first-year college  student living in a residence hall, or have one of several medical conditions, you need to get vaccinated against meningococcal disease. You may also need additional booster doses.  Hepatitis A vaccine.** / Consult your health care provider.  Hepatitis B vaccine.** / Consult your health care provider.  Haemophilus influenzae type b (Hib) vaccine.** / Consult your health care provider. Ages 40 to 64 years  Blood pressure check.** / Every 1 to 2 years.  Lipid and cholesterol check.** / Every 5 years beginning at age 20 years.  Lung cancer screening. / Every year if you are aged 55-80 years and have a 30-pack-year history of smoking and currently smoke or have quit within the past 15 years. Yearly screening is stopped once you have quit smoking for at least 15 years or develop a health problem that would prevent you from having lung cancer treatment.  Clinical breast exam.** / Every year after age 40 years.  BRCA-related cancer risk assessment.** / For women who have family members with a BRCA-related cancer (breast, ovarian, tubal, or peritoneal cancers).  Mammogram.** / Every year beginning at age 40 years and continuing for as long as you are in good health. Consult with your health care provider.  Pap test.** / Every 3 years starting at age 30 years through age 65 or 70 years with a history of 3 consecutive normal Pap tests.  HPV screening.** / Every 3 years from ages 30 years through ages 65 to 70 years with a history of 3 consecutive normal Pap tests.  Fecal occult blood test (FOBT) of stool. / Every year beginning at age 50 years and continuing until age 75 years. You may not need to do this test if you get a colonoscopy every 10 years.  Flexible sigmoidoscopy or colonoscopy.** / Every 5 years for a flexible sigmoidoscopy or every 10 years for a colonoscopy beginning at age 50 years and continuing until age 75 years.  Hepatitis C blood test.** / For all people born from 1945 through  1965 and any individual with known risks for hepatitis C.  Skin self-exam. / Monthly.  Influenza vaccine. / Every year.  Tetanus, diphtheria, and acellular pertussis (Tdap/Td) vaccine.** / Consult your health care provider. Pregnant women should receive 1 dose of Tdap vaccine during each pregnancy. 1 dose of Td every 10 years.  Varicella vaccine.** / Consult your health care provider. Pregnant females who do not have evidence of immunity should receive the first dose after pregnancy.  Zoster vaccine.** / 1 dose for adults aged 60 years or older.  Measles, mumps, rubella (MMR) vaccine.** / You need at least 1 dose of MMR if you were born in 1957 or later. You may also need a 2nd dose. For females of childbearing age, rubella immunity should be determined. If there is no evidence of immunity, females who are not pregnant should be vaccinated. If there is no evidence of immunity, females who are pregnant should delay immunization until after pregnancy.  Pneumococcal 13-valent conjugate (PCV13) vaccine.** / Consult your health care provider.  Pneumococcal polysaccharide (PPSV23) vaccine.** / 1 to 2 doses if you smoke cigarettes or if you have certain conditions.  Meningococcal vaccine.** / Consult your health care provider.  Hepatitis A vaccine.** / Consult your health care provider.  Hepatitis B vaccine.** / Consult your health care provider.  Haemophilus influenzae type b (Hib) vaccine.** / Consult your health care provider. Ages 65   years and over  Blood pressure check.** / Every 1 to 2 years.  Lipid and cholesterol check.** / Every 5 years beginning at age 22 years.  Lung cancer screening. / Every year if you are aged 73-80 years and have a 30-pack-year history of smoking and currently smoke or have quit within the past 15 years. Yearly screening is stopped once you have quit smoking for at least 15 years or develop a health problem that would prevent you from having lung cancer  treatment.  Clinical breast exam.** / Every year after age 4 years.  BRCA-related cancer risk assessment.** / For women who have family members with a BRCA-related cancer (breast, ovarian, tubal, or peritoneal cancers).  Mammogram.** / Every year beginning at age 40 years and continuing for as long as you are in good health. Consult with your health care provider.  Pap test.** / Every 3 years starting at age 9 years through age 34 or 91 years with 3 consecutive normal Pap tests. Testing can be stopped between 65 and 70 years with 3 consecutive normal Pap tests and no abnormal Pap or HPV tests in the past 10 years.  HPV screening.** / Every 3 years from ages 57 years through ages 64 or 45 years with a history of 3 consecutive normal Pap tests. Testing can be stopped between 65 and 70 years with 3 consecutive normal Pap tests and no abnormal Pap or HPV tests in the past 10 years.  Fecal occult blood test (FOBT) of stool. / Every year beginning at age 15 years and continuing until age 17 years. You may not need to do this test if you get a colonoscopy every 10 years.  Flexible sigmoidoscopy or colonoscopy.** / Every 5 years for a flexible sigmoidoscopy or every 10 years for a colonoscopy beginning at age 86 years and continuing until age 71 years.  Hepatitis C blood test.** / For all people born from [birth year] through 1965 and any individual with known risks for hepatitis C.  Osteoporosis screening.** / A one-time screening for women ages 83 years and over and women at risk for fractures or osteoporosis.  Skin self-exam. / Monthly.  Influenza vaccine. / Every year.  Tetanus, diphtheria, and acellular pertussis (Tdap/Td) vaccine.** / 1 dose of Td every 10 years.  Varicella vaccine.** / Consult your health care provider.  Zoster vaccine.** / 1 dose for adults aged 61 years or older.  Pneumococcal 13-valent conjugate (PCV13) vaccine.** / Consult your health care provider.  Pneumococcal  polysaccharide (PPSV23) vaccine.** / 1 dose for all adults aged 28 years and older.  Meningococcal vaccine.** / Consult your health care provider.  Hepatitis A vaccine.** / Consult your health care provider.  Hepatitis B vaccine.** / Consult your health care provider.  Haemophilus influenzae type b (Hib) vaccine.** / Consult your health care provider. ** Family history and personal history of risk and conditions may change your health care provider's recommendations. Document Released: 02/12/2002 Document Revised: 05/03/2014 Document Reviewed: 05/14/2011 [facility] Patient Information 2015 [place], [place]. This information is not intended to replace advice given to you by your health care provider. Make sure you discuss any questions you have with your health care provider.

## 2014-09-22 NOTE — Progress Notes (Signed)
    GYNECOLOGY CLINIC ANNUAL PREVENTATIVE CARE ENCOUNTER NOTE  Subjective:     Barbara Oconnell is a 34 y.o. G74P1011 female here for a routine annual gynecologic exam.  Current complaints: none.  Not sexually active.     Gynecologic History Patient's last menstrual period was 08/22/2014. Contraception: abstinence Last Pap: 05/28/12. Results were: normal  OB History  Gravida Para Term Preterm AB SAB TAB Ectopic Multiple Living  2 1 1  0 1 1 0 0 0 1    # Outcome Date GA Lbr Len/2nd Weight Sex Delivery Anes PTL Lv  2 TRM 12/18/12 [redacted]w[redacted]d 15:39 / 02:11 6 lb 15 oz (3.147 kg) F SVD Local  Y     Comments: WNL  1 SAB              Past Medical History  Diagnosis Date  . Anemia   . Fibroids   . Chlamydia     The following portions of the patient's history were reviewed and updated as appropriate: allergies, current medications, past family history, past medical history, past social history, past surgical history and problem list.  Review of Systems Pertinent items are noted in HPI.    Objective:   BP 105/57  Pulse 72  Ht 5\' 1"  (1.549 m)  Wt 260 lb 4.8 oz (118.071 kg)  BMI 49.21 kg/m2  LMP 08/22/2014  Breastfeeding? No GENERAL: Well-developed, obese female in no acute distress.  HEENT: Normocephalic, atraumatic. Sclerae anicteric.  NECK: Supple. Normal thyroid.  LUNGS: Clear to auscultation bilaterally.  HEART: Regular rate and rhythm.  BREASTS: Large, symmetric in size. No masses, skin changes, nipple drainage, or lymphadenopathy.  ABDOMEN: Soft, obese, nontender, nondistended. No organomegaly palpated. PELVIC: Normal external female genitalia. Vagina is pink and rugated. Normal discharge. Normal cervix contour. Pap smear obtained. Unable to palpate full size of fibroid uterus or adnexa secondary to habitus  EXTREMITIES: No cyanosis, clubbing, or edema, 2+ distal pulses.   Assessment:   Annual gynecologic examination   Plan:   Pap done, will follow up results and  manage accordingly. Routine preventative health maintenance measures emphasized   Verita Schneiders, MD, [place] Attending [place] [facility] [place]

## 2014-09-24 LAB — CYTOLOGY - PAP

## 2014-11-01 ENCOUNTER — Encounter: Payer: Self-pay | Admitting: Obstetrics and Gynecology

## 2014-12-03 ENCOUNTER — Encounter: Payer: Self-pay | Admitting: General Practice

## 2014-12-03 ENCOUNTER — Ambulatory Visit: Payer: 59 | Admitting: Nurse Practitioner

## 2014-12-03 ENCOUNTER — Telehealth: Payer: Self-pay | Admitting: General Practice

## 2014-12-03 NOTE — Telephone Encounter (Signed)
Patient no showed for appt today. Called patient, no answer- left message stating we are calling because it appears she missed an appt in our office today, please contact our front office staff to reschedule this appt. Will send letter

## 2016-10-17 ENCOUNTER — Telehealth: Payer: Self-pay

## 2016-10-17 NOTE — Telephone Encounter (Signed)
Pt called requesting another [place].  Contacted pt and was informed that she wanted another [place] to find out if her fibroids have gotten bigger. Pt stated that she is constipated and her PCP informed her that it could be related to her fibroids and the cause of her not losing weight.  Notified Dr. Ilda Basset, recommended that pt has an appt first before a [place] is scheduled.  I informed pt what provider recommended and pt stated understanding with no further questions.  I also informed her that her GYN appt has been scheduled for 11/16/16.  Pt stated that she would be here for her appt.

## 2016-11-16 ENCOUNTER — Ambulatory Visit (INDEPENDENT_AMBULATORY_CARE_PROVIDER_SITE_OTHER): Payer: 59 | Admitting: Obstetrics & Gynecology

## 2016-11-16 ENCOUNTER — Encounter: Payer: Self-pay | Admitting: Obstetrics & Gynecology

## 2016-11-16 ENCOUNTER — Other Ambulatory Visit (HOSPITAL_COMMUNITY): Payer: Self-pay | Admitting: Obstetrics & Gynecology

## 2016-11-16 VITALS — BP 124/82 | HR 86 | Ht 61.0 in | Wt 256.3 lb

## 2016-11-16 DIAGNOSIS — N946 Dysmenorrhea, unspecified: Secondary | ICD-10-CM | POA: Diagnosis not present

## 2016-11-16 DIAGNOSIS — R102 Pelvic and perineal pain: Secondary | ICD-10-CM | POA: Diagnosis not present

## 2016-11-16 DIAGNOSIS — G8929 Other chronic pain: Secondary | ICD-10-CM | POA: Diagnosis not present

## 2016-11-16 DIAGNOSIS — N938 Other specified abnormal uterine and vaginal bleeding: Secondary | ICD-10-CM | POA: Diagnosis not present

## 2016-11-16 MED ORDER — MISOPROSTOL 200 MCG PO TABS: ORAL_TABLET | ORAL | 0 refills | Status: DC

## 2016-11-16 NOTE — Progress Notes (Signed)
   Subjective:    Patient ID: Barbara Oconnell, female    DOB: Jul 10, 1980, 36 y.o.   MRN: [id number]  HPI  36 yo 26 AA P1(36 yo daughter) here today to discuss "uterine fibroids". She was diagnosed when she was 36 yo. Periods last 4-5 days, average flow, dysmenorrhea. They used to be q 28 days but now can come every 2 weeks. She has pelvic pain daily. She takes no OTC pain meds but didn't find them to be helpful in the past.   Review of Systems Works at [employer] Had a flu vaccine Abstinent since birth of her daughter.    Objective:   Physical Exam Pleasant morbidly obese BFNAD Breathing, conversing, and ambulating normally Abd- benign       Assessment & Plan:  Pelvic pain- schedule gyn [place] Dub- schedule an embx- pretreat with cytotec Check cbc, TSH

## 2016-11-17 LAB — CBC
HEMATOCRIT: 38.2 % (ref 34.0–46.6)
Hemoglobin: 12.4 g/dL (ref 11.1–15.9)
MCH: 27.1 pg (ref 26.6–33.0)
MCHC: 32.5 g/dL (ref 31.5–35.7)
MCV: 84 fL (ref 79–97)
PLATELETS: 400 10*3/uL — AB (ref 150–379)
RBC: 4.57 x10E6/uL (ref 3.77–5.28)
RDW: 16.3 % — ABNORMAL HIGH (ref 12.3–15.4)
WBC: 6.9 10*3/uL (ref 3.4–10.8)

## 2016-11-17 LAB — TSH: TSH: 4.91 u[IU]/mL — ABNORMAL HIGH (ref 0.450–4.500)

## 2016-11-21 ENCOUNTER — Telehealth: Payer: Self-pay | Admitting: *Deleted

## 2016-11-21 NOTE — Telephone Encounter (Signed)
Pt left message regarding her [place] scheduled for 11/29.  She stated that the 2 ultrasounds ordered are going to cost $1200 because she has a high deductible insurance plan ($1500). She wants to know if there is any other type of ultrasound which can be ordered that would be more cost effective. She further stated that if she ends up needing surgery, she will already be $1200 in the hole.

## 2016-11-28 ENCOUNTER — Ambulatory Visit (HOSPITAL_COMMUNITY)
Admission: RE | Admit: 2016-11-28 | Discharge: 2016-11-28 | Disposition: A | Discharge status: Home / Self Care | Payer: 59 | Source: Ambulatory Visit | Attending: Obstetrics & Gynecology | Admitting: Obstetrics & Gynecology

## 2016-11-28 DIAGNOSIS — N938 Other specified abnormal uterine and vaginal bleeding: Secondary | ICD-10-CM | POA: Insufficient documentation

## 2016-11-28 DIAGNOSIS — D259 Leiomyoma of uterus, unspecified: Secondary | ICD-10-CM | POA: Diagnosis not present

## 2016-12-03 NOTE — Telephone Encounter (Addendum)
Barbara Oconnell called this am and left another message stating she is calling about results. States she can see them on mychart , but not one has called. Also states Dr. Hulan Fray ordered 2 ultrasounds but doesn't know if the doctor has reviewed them.  States it is ok to leave a message.   I called [name] and was unable to leave a message- did not hear a voicemail pick up. Do see has follow up appt 12/10/16 for endo bx.

## 2016-12-10 ENCOUNTER — Other Ambulatory Visit (HOSPITAL_COMMUNITY)
Admission: RE | Admit: 2016-12-10 | Discharge: 2016-12-10 | Disposition: A | Discharge status: Home / Self Care | Payer: 59 | Source: Ambulatory Visit | Attending: Obstetrics & Gynecology | Admitting: Obstetrics & Gynecology

## 2016-12-10 ENCOUNTER — Ambulatory Visit (INDEPENDENT_AMBULATORY_CARE_PROVIDER_SITE_OTHER): Payer: 59 | Admitting: Obstetrics & Gynecology

## 2016-12-10 VITALS — BP 122/76 | Wt 240.0 lb

## 2016-12-10 DIAGNOSIS — N938 Other specified abnormal uterine and vaginal bleeding: Secondary | ICD-10-CM

## 2016-12-10 DIAGNOSIS — R7989 Other specified abnormal findings of blood chemistry: Secondary | ICD-10-CM

## 2016-12-10 LAB — POCT PREGNANCY, URINE: Preg Test, Ur: NEGATIVE

## 2016-12-10 NOTE — Addendum Note (Signed)
Addended by: Christiana Pellant A on: 12/10/2016 11:32 AM   Modules accepted: Orders

## 2016-12-10 NOTE — Progress Notes (Signed)
   Subjective:    Patient ID: Barbara Oconnell, female    DOB: 30-Oct-1980, 36 y.o.   MRN: [id number]  HPI 36 yo S AA P1 (36 yo daughter) here for [facility]. She has DUB and known fibroids. She took cytotec last night.   Review of Systems     Objective:   Physical Exam Pleasant BFNAD  UPT negative, consent signed, time out done Cervix prepped with betadine and grasped with a single tooth tenaculum Uterus sounded to 9 cm Pipelle used for 2 passes with a moderate amount of tissue obtained. She tolerated the procedure well.      Assessment & Plan:  Elevated TSH- check free T4 and free T3 DUB- await EMBX RTC 3 weeks to discuss treatment

## 2016-12-11 LAB — T3, FREE: T3, Free: 2.9 pg/mL (ref 2.0–4.4)

## 2016-12-11 LAB — T4, FREE: Free T4: 1.06 ng/dL (ref 0.82–1.77)

## 2016-12-17 ENCOUNTER — Encounter: Payer: Self-pay | Admitting: Obstetrics & Gynecology

## 2017-01-01 ENCOUNTER — Telehealth: Payer: Self-pay | Admitting: *Deleted

## 2017-01-01 NOTE — Telephone Encounter (Signed)
I called Barbara Oconnell back and we discussed that her appointment tomorrow was for follow up to discuss her results and options. She states she does not want another visit to have to pay a copay and she knows she did not need any other labs/ tests so she would like Barbara Oconnell to call her with options or a nurse. I explained I can send message to Barbara Oconnell asking her if we can relay her options or if she has to come in. She agreed with that plan. We also discussed her concern about getting pathology sent to [facility] and not [facility] as she requested because she works there. She wants to discuss that with Barbara Oconnell also. I explained we do not normally send pathology to [place] and I can not change anything now. I infomred her I would send message to [facility] and if she does not hear from Barbara Oconnell or [place] in a few days to call us back. She voices understanding.

## 2017-01-01 NOTE — Telephone Encounter (Signed)
Barbara Oconnell called this am and left a voice message she cancelled tomorrow's appointment because it was just a sit down and talk appointment.  She wanted to see what options the physician came up with and states we can let her know so she can think about them. States she also wanted to talk to the physician about sending pathology to lab that she didn't authorize. States the nurse sent email but she is not satisfied . States she is in control of her healthcare and thanks [place] for calling her.

## 2017-01-02 ENCOUNTER — Ambulatory Visit: Payer: Self-pay | Admitting: Obstetrics & Gynecology

## 2017-08-21 IMAGING — US US PELVIS COMPLETE
1 series · 15 of 25 positions shown · non-contrast
Comparison: None

CLINICAL DATA: Dysfunctional uterine bleeding



[Series 1: us pelvis complete · 15 of 93 slices shown]
[im 1/93]
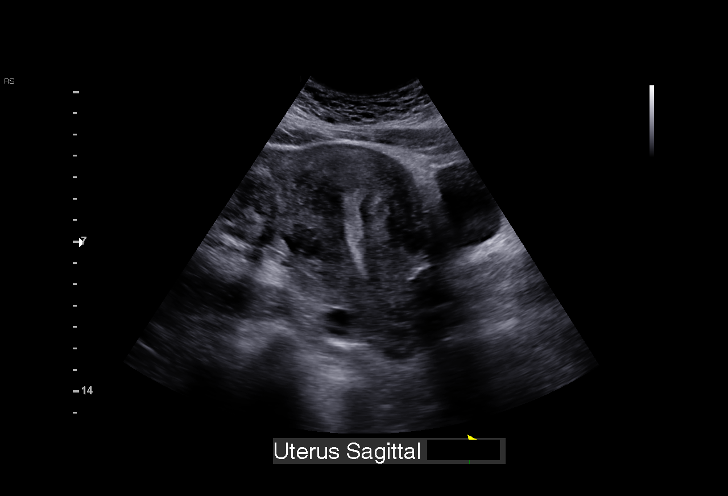
[im 8/93]
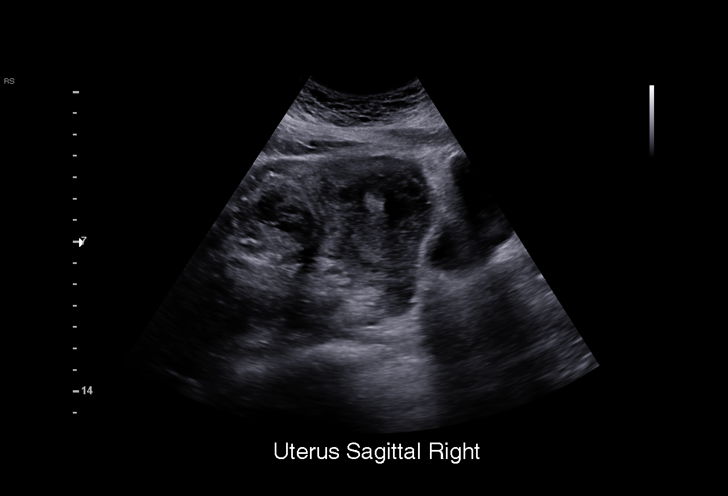
[im 16/93]
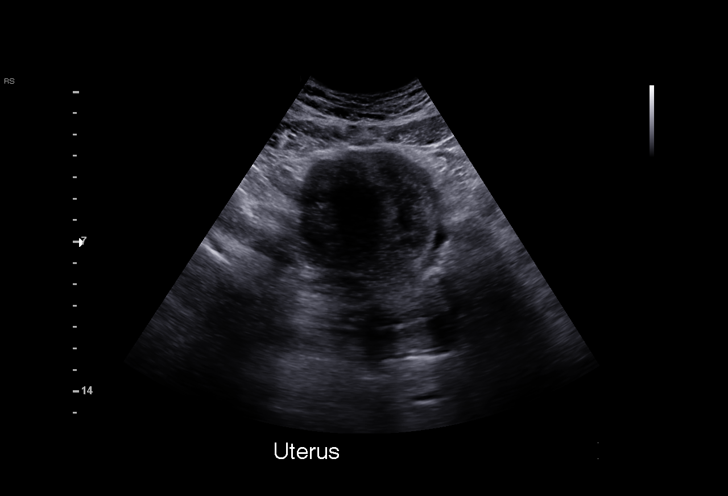
[im 20/93]
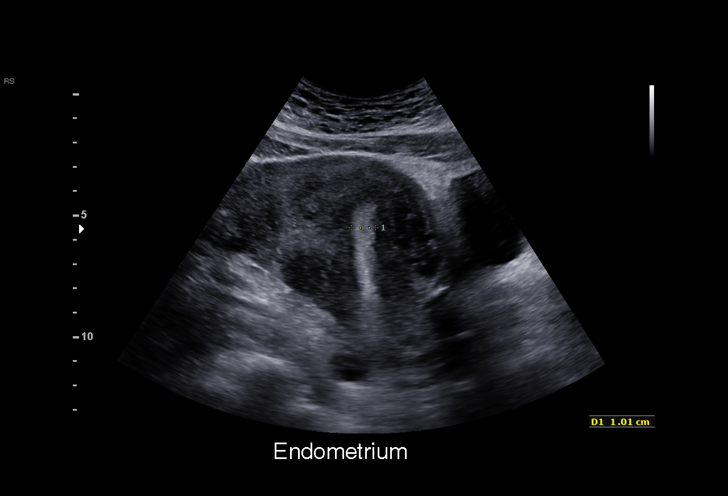
[im 27/93]
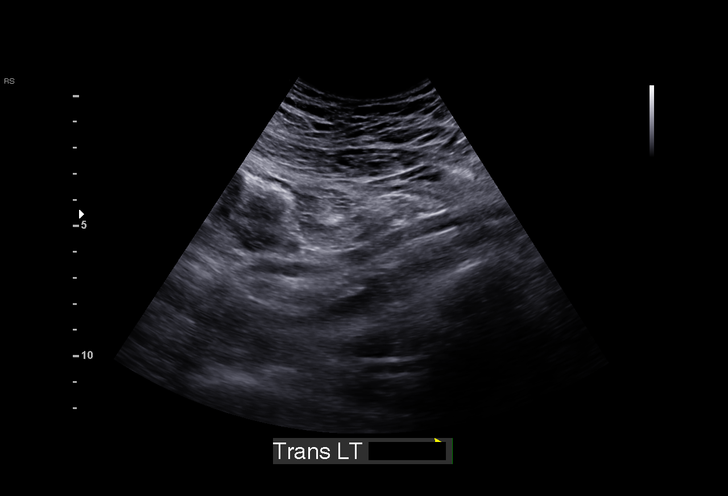
[im 35/93]
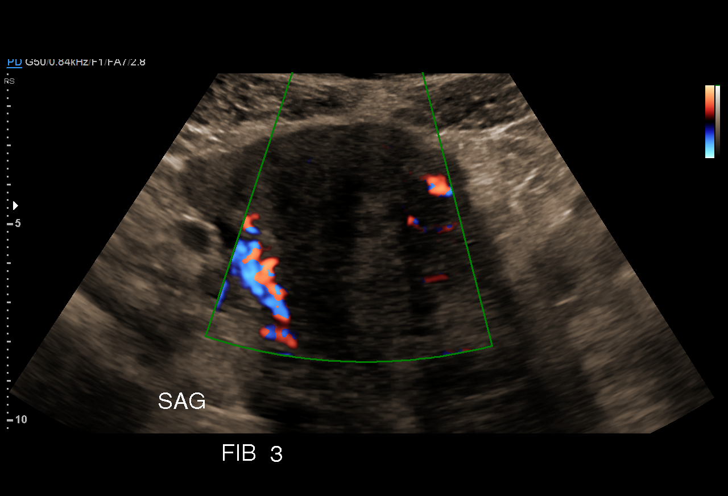
[im 39/93]
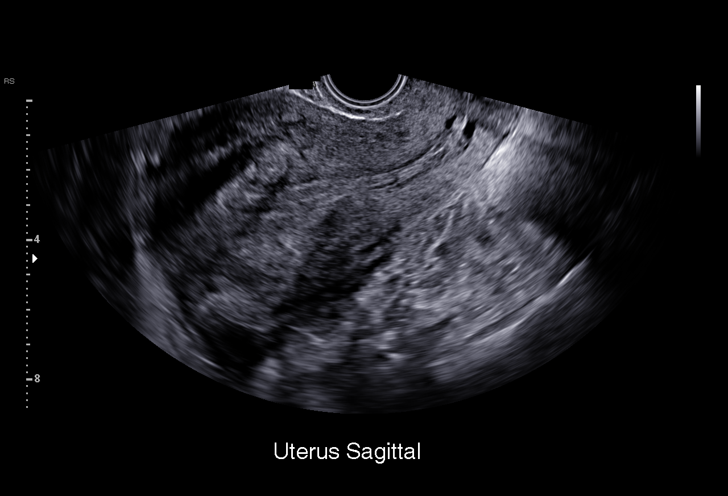
[im 47/93]
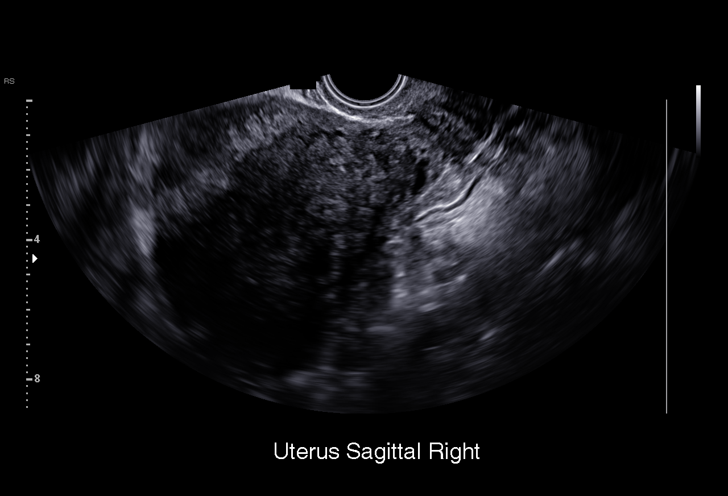
[im 54/93]
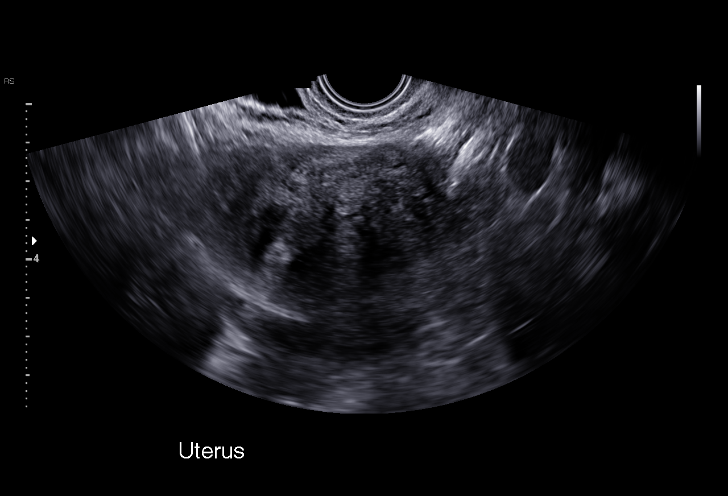
[im 58/93]
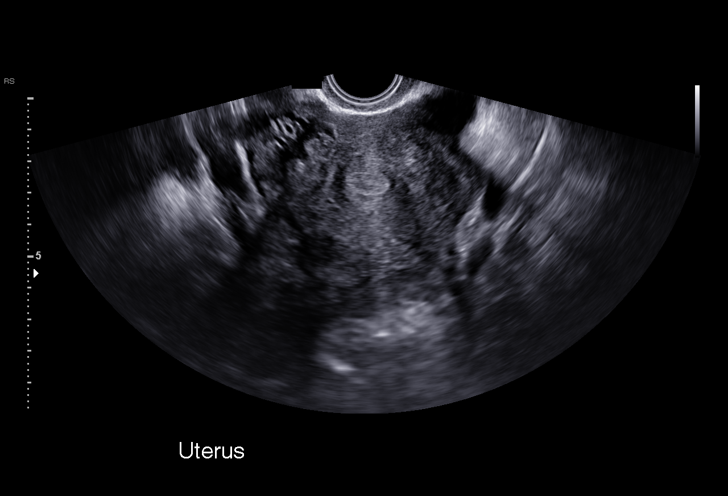
[im 66/93]
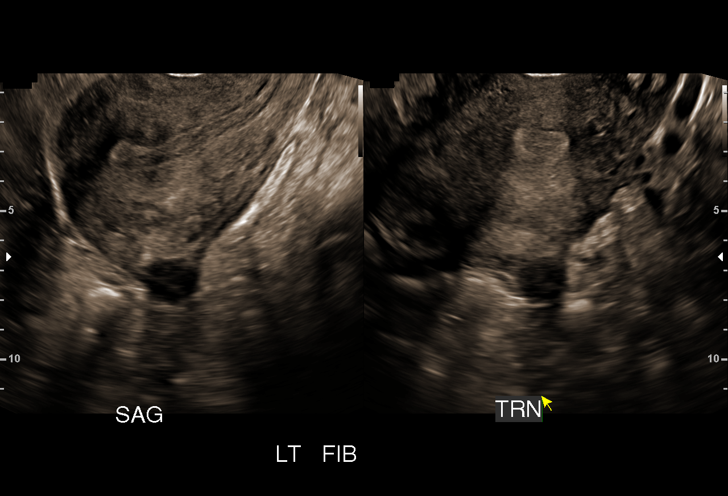
[im 73/93]
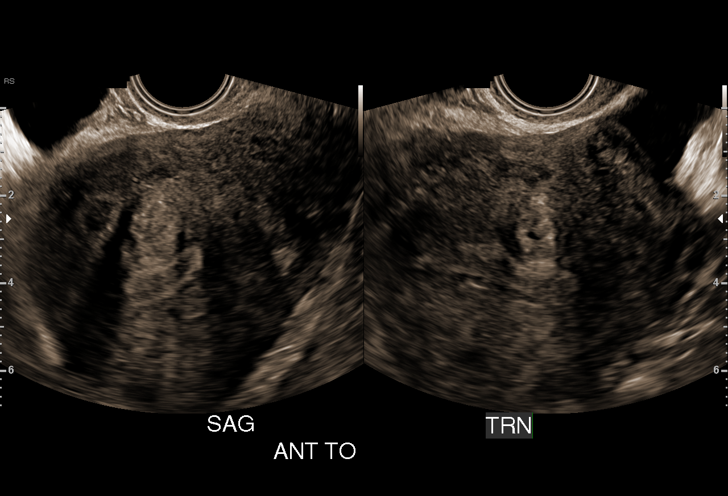
[im 77/93]
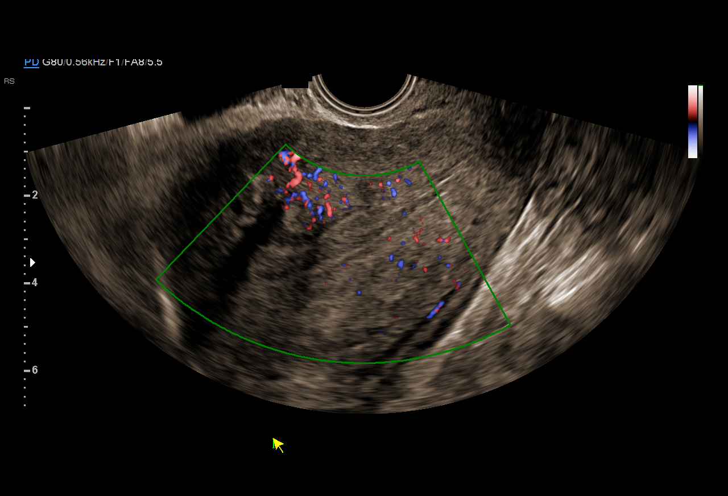
[im 85/93]
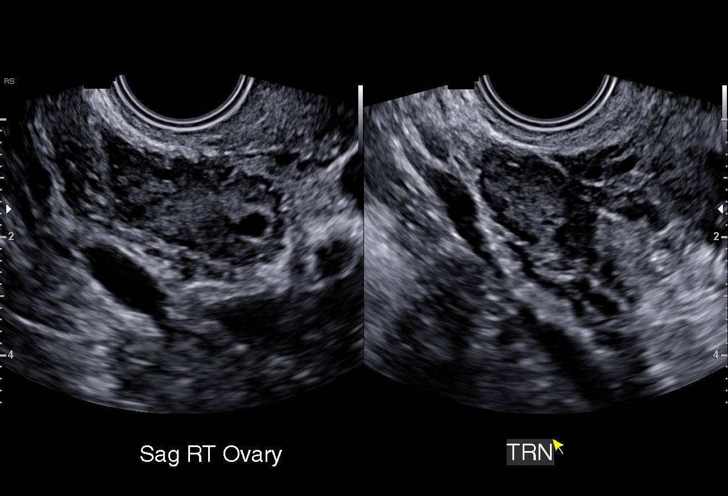
[im 93/93]
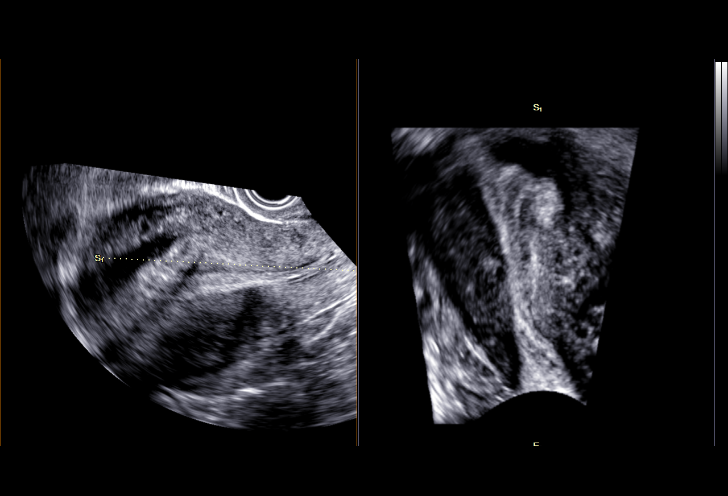

[15 of 25 positions shown; findings below may reference images not displayed]

FINDINGS: Uterus

Measurements: 11.3 x 7.4 x 8.4 cm. Diffusely heterogeneous
echotexture. At least 4 focal fibroids. Right fundal subserosal
fibroid measures up to 5.4 cm. Right body subserosal fibroid
measures up to 2.0 cm. Left fundal submucosal fibroid measures up to
1.8 cm. Right submucosal fibroid measures up to 1.9 cm.

Endometrium

Thickness: 12 mm.  No focal abnormality visualized.

Right ovary

Measurements: 3.2 x 1.5 x 2.0 cm. Normal appearance/no adnexal mass.

Left ovary

Measurements: 2.4 x 2.1 x 2.5 cm. Normal appearance/no adnexal mass.

Other findings

No abnormal free fluid.
IMPRESSION: Multiple uterine fibroids, the largest 5.3 cm in the right fundus.

## 2020-03-21 ENCOUNTER — Ambulatory Visit: Payer: Self-pay

## 2020-03-23 ENCOUNTER — Ambulatory Visit: Payer: Self-pay | Attending: Internal Medicine

## 2020-03-23 DIAGNOSIS — Z23 Encounter for immunization: Secondary | ICD-10-CM

## 2020-03-23 NOTE — Progress Notes (Signed)
   Covid-19 Vaccination Clinic  Name:  Barbara Oconnell    MRN: [id number] DOB: Oct 09, 1980  03/23/2020  Barbara Oconnell was observed post Covid-19 immunization for 15 minutes without incident. She was provided with Vaccine Information Sheet and instruction to access the V-Safe system.   Barbara Oconnell was instructed to call 911 with any severe reactions post vaccine: Marland Kitchen Difficulty breathing  . Swelling of face and throat  . A fast heartbeat  . A bad rash all over body  . Dizziness and weakness   Immunizations Administered    Name Date Dose VIS Date Route   Pfizer COVID-19 Vaccine 03/23/2020  3:36 PM 0.3 mL 12/11/2019 Intramuscular   Manufacturer: [place]   Lot: [id number]   [place]: [id number]

## 2020-04-13 ENCOUNTER — Ambulatory Visit: Payer: Self-pay | Attending: Internal Medicine

## 2020-04-13 DIAGNOSIS — Z23 Encounter for immunization: Secondary | ICD-10-CM

## 2020-04-13 NOTE — Progress Notes (Signed)
   Covid-19 Vaccination Clinic  Name:  Barbara Oconnell    MRN: [id number] DOB: [birth year]  04/13/2020  [name] was observed post Covid-19 immunization for 15 minutes without incident. She was provided with Vaccine Information Sheet and instruction to access the V-Safe system.   [name] was instructed to call 911 with any severe reactions post vaccine: Marland Kitchen Difficulty breathing  . Swelling of face and throat  . A fast heartbeat  . A bad rash all over body  . Dizziness and weakness   Immunizations Administered    Name Date Dose VIS Date Route   Pfizer COVID-19 Vaccine 04/13/2020 12:45 PM 0.3 mL 12/11/2019 Intramuscular   Manufacturer: [place]   Lot: [id number]   [place]: [id number]

## 2020-04-20 ENCOUNTER — Other Ambulatory Visit: Payer: Self-pay | Admitting: Obstetrics and Gynecology

## 2020-04-20 ENCOUNTER — Encounter (HOSPITAL_BASED_OUTPATIENT_CLINIC_OR_DEPARTMENT_OTHER): Payer: Self-pay | Admitting: Obstetrics and Gynecology

## 2020-04-20 ENCOUNTER — Other Ambulatory Visit: Payer: Self-pay

## 2020-04-20 NOTE — Progress Notes (Addendum)
ADDENDUM:  Chart reviewed by anesthesia, Konrad Felix PA, ok to proceed.   Spoke w/ via phone for pre-op interview--- PT Lab needs dos---  CBC, BMP, Urine preg,EKG Lab results------ no COVID test ------ 04-21-2020 @ 0800 Arrive at ------- 0730 NPO after ------  MN Medications to take morning of surgery ----- Megace w/ sips of water Diabetic medication ----- do not take metformin morning of surgery Patient Special Instructions ----- n/a Pre-Op special Istructions ----- n/a Patient verbalized understanding of instructions that were given at this phone interview. Patient denies shortness of breath, chest pain, fever, cough a this phone interview.   Anesthesia Review:  Hx pre-diabetes,  BMI 47.99 (5'1" 254 lb);  Chart to be reviewed by Konrad Felix PA.  PCP:  Sofie Rower PA  EKG :  no   Sleep Study/ CPAP :  NO Fasting Blood Sugar :      / Checks Blood Sugar -- times a day:   Does not check Blood Thinner/ Instructions /Last Dose:  NO ASA / Instructions/ Last Dose :  NO

## 2020-04-21 ENCOUNTER — Other Ambulatory Visit (HOSPITAL_COMMUNITY)
Admission: RE | Admit: 2020-04-21 | Discharge: 2020-04-21 | Disposition: A | Discharge status: Home / Self Care | Payer: Managed Care, Other (non HMO) | Source: Ambulatory Visit | Attending: Obstetrics and Gynecology | Admitting: Obstetrics and Gynecology

## 2020-04-21 DIAGNOSIS — Z01812 Encounter for preprocedural laboratory examination: Secondary | ICD-10-CM | POA: Diagnosis present

## 2020-04-21 DIAGNOSIS — Z20822 Contact with and (suspected) exposure to covid-19: Secondary | ICD-10-CM | POA: Diagnosis not present

## 2020-04-21 LAB — SARS CORONAVIRUS 2 (TAT 6-24 HRS): SARS Coronavirus 2: NEGATIVE

## 2020-04-22 ENCOUNTER — Encounter (HOSPITAL_BASED_OUTPATIENT_CLINIC_OR_DEPARTMENT_OTHER)
Admission: RE | Disposition: A | Discharge status: Home / Self Care | Payer: Self-pay | Source: Home / Self Care | Attending: Obstetrics and Gynecology

## 2020-04-22 ENCOUNTER — Ambulatory Visit (HOSPITAL_BASED_OUTPATIENT_CLINIC_OR_DEPARTMENT_OTHER): Payer: Managed Care, Other (non HMO) | Admitting: Physician Assistant

## 2020-04-22 ENCOUNTER — Ambulatory Visit (HOSPITAL_BASED_OUTPATIENT_CLINIC_OR_DEPARTMENT_OTHER)
Admission: RE | Admit: 2020-04-22 | Discharge: 2020-04-22 | Disposition: A | Discharge status: Home / Self Care | Payer: Managed Care, Other (non HMO) | Attending: Obstetrics and Gynecology | Admitting: Obstetrics and Gynecology

## 2020-04-22 ENCOUNTER — Other Ambulatory Visit: Payer: Self-pay

## 2020-04-22 ENCOUNTER — Encounter (HOSPITAL_BASED_OUTPATIENT_CLINIC_OR_DEPARTMENT_OTHER): Payer: Self-pay | Admitting: Obstetrics and Gynecology

## 2020-04-22 DIAGNOSIS — N840 Polyp of corpus uteri: Secondary | ICD-10-CM | POA: Diagnosis not present

## 2020-04-22 DIAGNOSIS — Z6841 Body Mass Index (BMI) 40.0 and over, adult: Secondary | ICD-10-CM | POA: Diagnosis not present

## 2020-04-22 DIAGNOSIS — Z20822 Contact with and (suspected) exposure to covid-19: Secondary | ICD-10-CM | POA: Insufficient documentation

## 2020-04-22 DIAGNOSIS — N938 Other specified abnormal uterine and vaginal bleeding: Secondary | ICD-10-CM | POA: Insufficient documentation

## 2020-04-22 DIAGNOSIS — R7303 Prediabetes: Secondary | ICD-10-CM | POA: Diagnosis not present

## 2020-04-22 HISTORY — DX: Other specified abnormal uterine and vaginal bleeding: N93.8

## 2020-04-22 HISTORY — DX: Presence of spectacles and contact lenses: Z97.3

## 2020-04-22 HISTORY — DX: Leiomyoma of uterus, unspecified: D25.9

## 2020-04-22 HISTORY — DX: Polyp of corpus uteri: N84.0

## 2020-04-22 HISTORY — DX: Prediabetes: R73.03

## 2020-04-22 HISTORY — DX: Personal history of diseases of the blood and blood-forming organs and certain disorders involving the immune mechanism: Z86.2

## 2020-04-22 HISTORY — DX: Personal history of other infectious and parasitic diseases: Z86.19

## 2020-04-22 HISTORY — PX: DILATATION & CURETTAGE/HYSTEROSCOPY WITH MYOSURE: SHX6511

## 2020-04-22 LAB — BASIC METABOLIC PANEL
Anion gap: 9 (ref 5–15)
BUN: 14 mg/dL (ref 6–20)
CO2: 21 mmol/L — ABNORMAL LOW (ref 22–32)
Calcium: 9.4 mg/dL (ref 8.9–10.3)
Chloride: 108 mmol/L (ref 98–111)
Creatinine, Ser: 0.68 mg/dL (ref 0.44–1.00)
GFR calc Af Amer: >60 mL/min (ref >60)
GFR calc non Af Amer: >60 mL/min (ref >60)
Glucose, Bld: 116 mg/dL — ABNORMAL HIGH (ref 70–99)
Potassium: 4 mmol/L (ref 3.5–5.1)
Sodium: 138 mmol/L (ref 135–145)

## 2020-04-22 LAB — CBC
HCT: 39 % (ref 36.0–46.0)
Hemoglobin: 12.3 g/dL (ref 12.0–15.0)
MCH: 28.5 pg (ref 26.0–34.0)
MCHC: 31.5 g/dL (ref 30.0–36.0)
MCV: 90.3 fL (ref 80.0–100.0)
Platelets: 513 10*3/uL — ABNORMAL HIGH (ref 150–400)
RBC: 4.32 MIL/uL (ref 3.87–5.11)
RDW: 13.4 % (ref 11.5–15.5)
WBC: 7.2 10*3/uL (ref 4.0–10.5)
nRBC: 0 % (ref 0.0–0.2)

## 2020-04-22 LAB — POCT PREGNANCY, URINE: Preg Test, Ur: NEGATIVE

## 2020-04-22 LAB — GLUCOSE, CAPILLARY: Glucose-Capillary: 98 mg/dL (ref 70–99)

## 2020-04-22 SURGERY — DILATATION & CURETTAGE/HYSTEROSCOPY WITH MYOSURE
Anesthesia: General | Site: Vagina

## 2020-04-22 MED ORDER — MIDAZOLAM HCL 2 MG/2ML IJ SOLN
INTRAMUSCULAR | Status: AC
  Filled 2020-04-22: qty 2

## 2020-04-22 MED ORDER — FENTANYL CITRATE (PF) 100 MCG/2ML IJ SOLN
INTRAMUSCULAR | Status: AC
  Filled 2020-04-22: qty 2

## 2020-04-22 MED ORDER — OXYCODONE HCL 5 MG/5ML PO SOLN: 5.0000 mg | Freq: Once | ORAL | Status: DC | PRN

## 2020-04-22 MED ORDER — KETOROLAC TROMETHAMINE 30 MG/ML IJ SOLN
INTRAMUSCULAR | Status: DC | PRN
Start: 2020-04-22 — End: 2020-04-22
  Administered 2020-04-22: 30 mg via INTRAVENOUS

## 2020-04-22 MED ORDER — DEXAMETHASONE SODIUM PHOSPHATE 10 MG/ML IJ SOLN
INTRAMUSCULAR | Status: AC
  Filled 2020-04-22: qty 1

## 2020-04-22 MED ORDER — ONDANSETRON HCL 4 MG/2ML IJ SOLN
INTRAMUSCULAR | Status: AC
  Filled 2020-04-22: qty 2

## 2020-04-22 MED ORDER — PROPOFOL 500 MG/50ML IV EMUL
INTRAVENOUS | Status: DC | PRN
  Administered 2020-04-22: 200 mg via INTRAVENOUS

## 2020-04-22 MED ORDER — FENTANYL CITRATE (PF) 100 MCG/2ML IJ SOLN: 25.0000 ug | INTRAMUSCULAR | Status: DC | PRN

## 2020-04-22 MED ORDER — LIDOCAINE 2% (20 MG/ML) 5 ML SYRINGE
INTRAMUSCULAR | Status: DC | PRN
  Administered 2020-04-22: 60 mg via INTRAVENOUS

## 2020-04-22 MED ORDER — ACETAMINOPHEN 10 MG/ML IV SOLN: 1000.0000 mg | Freq: Once | INTRAVENOUS | Status: DC | PRN

## 2020-04-22 MED ORDER — LACTATED RINGERS IV SOLN: INTRAVENOUS | Status: DC

## 2020-04-22 MED ORDER — SODIUM CHLORIDE 0.9 % IR SOLN
Status: DC | PRN
  Administered 2020-04-22: 3000 mL

## 2020-04-22 MED ORDER — ONDANSETRON HCL 4 MG/2ML IJ SOLN
INTRAMUSCULAR | Status: DC | PRN
  Administered 2020-04-22: 4 mg via INTRAVENOUS

## 2020-04-22 MED ORDER — MIDAZOLAM HCL 2 MG/2ML IJ SOLN
INTRAMUSCULAR | Status: DC | PRN
  Administered 2020-04-22: 2 mg via INTRAVENOUS

## 2020-04-22 MED ORDER — LIDOCAINE 2% (20 MG/ML) 5 ML SYRINGE
INTRAMUSCULAR | Status: AC
  Filled 2020-04-22: qty 5

## 2020-04-22 MED ORDER — DEXAMETHASONE SODIUM PHOSPHATE 10 MG/ML IJ SOLN
INTRAMUSCULAR | Status: DC | PRN
  Administered 2020-04-22: 10 mg via INTRAVENOUS

## 2020-04-22 MED ORDER — OXYCODONE HCL 5 MG PO TABS: 5.0000 mg | ORAL_TABLET | Freq: Once | ORAL | Status: DC | PRN

## 2020-04-22 MED ORDER — PROPOFOL 10 MG/ML IV BOLUS
INTRAVENOUS | Status: AC
  Filled 2020-04-22: qty 20

## 2020-04-22 MED ORDER — FENTANYL CITRATE (PF) 100 MCG/2ML IJ SOLN
INTRAMUSCULAR | Status: DC | PRN
  Administered 2020-04-22: 50 ug via INTRAVENOUS

## 2020-04-22 SURGICAL SUPPLY — 25 items
BIPOLAR CUTTING LOOP 21FR (ELECTRODE)
CANISTER SUCT 3000ML PPV (MISCELLANEOUS) ×1 IMPLANT
CATH ROBINSON RED A/P 16FR (CATHETERS) ×2 IMPLANT
COVER WAND RF STERILE (DRAPES) ×3 IMPLANT
DEVICE MYOSURE LITE (MISCELLANEOUS) ×2 IMPLANT
DEVICE MYOSURE REACH (MISCELLANEOUS) IMPLANT
DILATOR CANAL MILEX (MISCELLANEOUS) IMPLANT
GAUZE 4X4 16PLY RFD (DISPOSABLE) ×3 IMPLANT
GLOVE BIOGEL PI IND STRL 7.0 (GLOVE) ×1 IMPLANT
GLOVE BIOGEL PI INDICATOR 7.0 (GLOVE) ×2
GLOVE ECLIPSE 6.5 STRL STRAW (GLOVE) ×3 IMPLANT
GOWN STRL REUS W/TWL LRG LVL3 (GOWN DISPOSABLE) ×3 IMPLANT
IV NS IRRIG 3000ML ARTHROMATIC (IV SOLUTION) ×3 IMPLANT
KIT PROCEDURE FLUENT (KITS) ×3 IMPLANT
KIT TURNOVER CYSTO (KITS) ×3 IMPLANT
LOOP CUTTING BIPOLAR 21FR (ELECTRODE) IMPLANT
MYOSURE XL FIBROID (MISCELLANEOUS)
NS IRRIG 500ML POUR BTL (IV SOLUTION) ×2 IMPLANT
PACK VAGINAL MINOR WOMEN LF (CUSTOM PROCEDURE TRAY) ×3 IMPLANT
PAD OB MATERNITY 4.3X12.25 (PERSONAL CARE ITEMS) ×3 IMPLANT
PAD PREP 24X48 CUFFED NSTRL (MISCELLANEOUS) ×3 IMPLANT
SEAL CERVICAL OMNI LOK (ABLATOR) IMPLANT
SEAL ROD LENS SCOPE MYOSURE (ABLATOR) ×3 IMPLANT
SYSTEM TISS REMOVAL MYOSURE XL (MISCELLANEOUS) IMPLANT
WATER STERILE IRR 500ML POUR (IV SOLUTION) ×1 IMPLANT

## 2020-04-22 NOTE — Brief Op Note (Signed)
04/22/2020  10:05 AM  PATIENT:  Barbara Oconnell  40 y.o. female  PRE-OPERATIVE DIAGNOSIS:  Abnormal  Uterine Bleeding, Endometrial polyp/mass  POST-OPERATIVE DIAGNOSIS:  Abnormal  Uterine Bleeding, Endometrial mass  PROCEDURE:  Diagnostic hysteroscopy, hysteroscopic resection of endometrial polyp, D&C  SURGEON:  Surgeon(s) and Role:    * Servando Salina, MD - Primary  PHYSICIAN ASSISTANT:   ASSISTANTS: none   ANESTHESIA:   general Finding: nl tubal ostia, endom cavity with clots and ? Infarcted endom polyp EBL:  10 mL   BLOOD ADMINISTERED:none  DRAINS: none   LOCAL MEDICATIONS USED:  NONE  SPECIMEN:  Source of Specimen:  emc with polyp  DISPOSITION OF SPECIMEN:  PATHOLOGY  COUNTS:  YES  TOURNIQUET:  * No tourniquets in log *  DICTATION: .Other Dictation: Dictation Number [id number]  PLAN OF CARE: Discharge to home after PACU  PATIENT DISPOSITION:  PACU - hemodynamically stable.   Delay start of Pharmacological VTE agent (>24hrs) due to surgical blood loss or risk of bleeding: no

## 2020-04-22 NOTE — H&P (Signed)
Barbara Oconnell is an 40 y.o. female. BF with DUB on megace here for dx hysteroscopy, D&C, resection of endometrial polyp noted on sonohysterogram  Pertinent Gynecological History: Menses: prolonged Bleeding: dysfunctional uterine bleeding Contraception: none DES exposure: denies Blood transfusions: none Sexually transmitted diseases: no past history Previous GYN Procedures: none  Last mammogram: n/a  Date:n/a Last pap: normal Date: 2020    Menstrual History: Menarche age: n/a Patient's last menstrual period was 02/28/2020.    Past Medical History:  Diagnosis Date  . DUB (dysfunctional uterine bleeding)   . Endometrial polyp   . History of anemia   . History of chlamydia   . Pre-diabetes   . Uterine fibroid   . Wears contact lenses     Past Surgical History:  Procedure Laterality Date  . DILATION AND CURETTAGE OF UTERUS  07-18-2006  @ [facility]   w/ suction    Family History  Problem Relation Age of Onset  . Cancer Mother        breast  . Diabetes Father     Social History:  reports that she has never smoked. She has never used smokeless tobacco. She reports current alcohol use. She reports that she does not use drugs.  Allergies: No Known Allergies  No medications prior to admission.    Review of Systems  All other systems reviewed and are negative.   Height 5\' 1"  (1.549 m), weight 115.2 kg, last menstrual period 02/28/2020. Physical Exam  Constitutional: She is oriented to person, place, and time. She appears well-developed and well-nourished.  HENT:  Head: Atraumatic.  Eyes: EOM are normal.  Cardiovascular: Regular rhythm.  Respiratory: Effort normal.  GI: Soft.  obese  Genitourinary:    Genitourinary Comments: Vulva nl Vaginal (+) blood Cervix closed Uterus AV sl enlarged Adnexa nonpalp   Musculoskeletal:        General: Normal range of motion.     Cervical back: Neck supple.  Neurological: She is alert and oriented to person, place, and time.   Skin: Skin is warm and dry.  Psychiatric: She has a normal mood and affect.    Results for orders placed or performed during the hospital encounter of 04/21/20 (from the past 24 hour(s))  SARS CORONAVIRUS 2 (TAT 6-24 HRS) Nasopharyngeal Nasopharyngeal Swab     Status: None   Collection Time: 04/21/20  9:18 AM   Specimen: Nasopharyngeal Swab  Result Value Ref Range   SARS Coronavirus 2 NEGATIVE NEGATIVE    No results found.  Assessment/Plan: DUB Endometrial mass P) dx hysteroscopy, D&C, resection of endometrial polyp. Risk of surgery reviewed including infection, bleeding, injury to surrounding organ structures, uterine perforation ( 12/998) and its risk, thermal injury, fluid overload and its mgmt. All ? answered   Jeidi Gilles A Dicy Smigel 04/22/2020, 1:49 AM

## 2020-04-22 NOTE — Anesthesia Procedure Notes (Signed)
Procedure Name: LMA Insertion Date/Time: 04/22/2020 9:34 AM Performed by: Gerald Leitz, CRNA Pre-anesthesia Checklist: Patient identified, Patient being monitored, Timeout performed, Emergency Drugs available and Suction available Patient Re-evaluated:Patient Re-evaluated prior to induction Oxygen Delivery Method: Circle system utilized Preoxygenation: Pre-oxygenation with 100% oxygen Induction Type: IV induction Ventilation: Mask ventilation without difficulty LMA: LMA inserted and LMA with gastric port inserted LMA Size: 4.0 Tube type: Oral Number of attempts: 1 Placement Confirmation: positive ETCO2 and breath sounds checked- equal and bilateral Tube secured with: Tape Dental Injury: Teeth and Oropharynx as per pre-operative assessment

## 2020-04-22 NOTE — Anesthesia Preprocedure Evaluation (Addendum)
Anesthesia Evaluation  Patient identified by MRN, date of birth, ID band Patient awake    Reviewed: Allergy & Precautions, NPO status , Patient's Chart, lab work & pertinent test results  Airway Mallampati: II  TM Distance: >3 FB Neck ROM: Full    Dental no notable dental hx.    Pulmonary neg pulmonary ROS,    Pulmonary exam normal breath sounds clear to auscultation       Cardiovascular negative cardio ROS Normal cardiovascular exam Rhythm:Regular Rate:Normal     Neuro/Psych negative neurological ROS  negative psych ROS   GI/Hepatic negative GI ROS, Neg liver ROS,   Endo/Other  Morbid obesity  Renal/GU negative Renal ROS  negative genitourinary   Musculoskeletal negative musculoskeletal ROS (+)   Abdominal   Peds negative pediatric ROS (+)  Hematology negative hematology ROS (+)   Anesthesia Other Findings   Reproductive/Obstetrics negative OB ROS                             Anesthesia Physical Anesthesia Plan  ASA: II  Anesthesia Plan: General   Post-op Pain Management:    Induction: Intravenous  PONV Risk Score and Plan: 3 and Ondansetron, Dexamethasone, Midazolam and Treatment may vary due to age or medical condition  Airway Management Planned: LMA  Additional Equipment:   Intra-op Plan:   Post-operative Plan: Extubation in OR  Informed Consent: I have reviewed the patients History and Physical, chart, labs and discussed the procedure including the risks, benefits and alternatives for the proposed anesthesia with the patient or authorized representative who has indicated his/her understanding and acceptance.     Dental advisory given  Plan Discussed with: CRNA and Surgeon  Anesthesia Plan Comments:        Anesthesia Quick Evaluation

## 2020-04-22 NOTE — Anesthesia Postprocedure Evaluation (Signed)
Anesthesia Post Note  Patient: Barbara Oconnell  Procedure(s) Performed: DILATATION & CURETTAGE/HYSTEROSCOPY WITH MYOSURE (N/A Vagina )     Patient location during evaluation: PACU Anesthesia Type: General Level of consciousness: awake and alert Pain management: pain level controlled Vital Signs Assessment: post-procedure vital signs reviewed and stable Respiratory status: spontaneous breathing, nonlabored ventilation, respiratory function stable and patient connected to nasal cannula oxygen Cardiovascular status: blood pressure returned to baseline and stable Postop Assessment: no apparent nausea or vomiting Anesthetic complications: no    Last Vitals:  Vitals:   04/22/20 1045 04/22/20 1110  BP: 138/81 132/79  Pulse: 89 81  Resp: 12 16  Temp:  36.8 C  SpO2: 100% 100%    Last Pain:  Vitals:   04/22/20 1110  TempSrc: Oral  PainSc: 0-No pain                 Kirra Verga S

## 2020-04-22 NOTE — Discharge Instructions (Signed)
CALL  IF TEMP>100.4, NOTHING PER VAGINA X 2 WK, CALL IF SOAKING A MAXI  PAD EVERY HOUR OR MORE FREQUENTLYCALL  IF TEMP>100.4, NOTHING PER VAGINA X 2 WK, CALL IF SOAKING A MAXI  PAD EVERY HOUR OR MORE FREQUENTLY  Post Anesthesia Home Care Instructions  Activity: Get plenty of rest for the remainder of the day. A responsible adult should stay with you for 24 hours following the procedure.  For the next 24 hours, DO NOT: -Drive a car -Paediatric nurse -Drink alcoholic beverages -Take any medication unless instructed by your physician -Make any legal decisions or sign important papers.  Meals: Start with liquid foods such as gelatin or soup. Progress to regular foods as tolerated. Avoid greasy, spicy, heavy foods. If nausea and/or vomiting occur, drink only clear liquids until the nausea and/or vomiting subsides. Call your physician if vomiting continues.  Special Instructions/Symptoms: Your throat may feel dry or sore from the anesthesia or the breathing tube placed in your throat during surgery. If this causes discomfort, gargle with warm salt water. The discomfort should disappear within 24 hours.  If you had a scopolamine patch placed behind your ear for the management of post- operative nausea and/or vomiting:  1. The medication in the patch is effective for 72 hours, after which it should be removed.  Wrap patch in a tissue and discard in the trash. Wash hands thoroughly with soap and water. 2. You may remove the patch earlier than 72 hours if you experience unpleasant side effects which may include dry mouth, dizziness or visual disturbances. 3. Avoid touching the patch. Wash your hands with soap and water after contact with the patch.     [facility] & [facility] Home care Instructions:   Personal hygiene:  Used sanitary napkins for vaginal drainage not tampons. Shower or tub bathe the day after your procedure. No douching until bleeding stops. Always wipe from front to back after   Elimination.  Activity: Do not drive or operate any equipment today. The effects of the anesthesia are still present and drowsiness may result. Rest today, not necessarily flat bed rest, just take it easy. You may resume your normal activity in one to 2 days.  Sexual activity: No intercourse for one week or as indicated by your physician  Diet: Eat a light diet as desired this evening. You may resume a regular diet tomorrow.  Return to work: One to 2 days.  General Expectations of your surgery: Vaginal bleeding should be no heavier than a normal period. Spotting may continue up to 10 days. Mild cramps may continue for a couple of days. You may have a regular period in 2-6 weeks.  Unexpected observations call your doctor if these occur: persistent or heavy bleeding. Severe abdominal cramping or pain. Elevation of temperature greater than 100F.  Call for an appointment in one week.   NO ADVIL, ALEVE,MOTRIN, IBUPROFEN UNTIL3:30 PM TODAY

## 2020-04-22 NOTE — Interval H&P Note (Signed)
History and Physical Interval Note:  04/22/2020 9:16 AM  Barbara Oconnell  has presented today for surgery, with the diagnosis of Dysfunctional Uterine Bleeding, Endometrial Polyp.  The various methods of treatment have been discussed with the patient and family. After consideration of risks, benefits and other options for treatment, the patient has consented to  Procedure(s): [place] (N/A) as a surgical intervention.  The patient's history has been reviewed, patient examined, no change in status, stable for surgery.  I have reviewed the patient's chart and labs.  Questions were answered to the patient's satisfaction.     Edwena Mayorga A Devion Chriscoe

## 2020-04-22 NOTE — Transfer of Care (Signed)
Immediate Anesthesia Transfer of Care Note  Patient: Barbara Oconnell  Procedure(s) Performed: Procedure(s): DILATATION & CURETTAGE/HYSTEROSCOPY WITH MYOSURE (N/A)  Patient Location: PACU  Anesthesia Type:General  Level of Consciousness: Alert, Awake, Oriented  Airway & Oxygen Therapy: Patient Spontanous Breathing  Post-op Assessment: Report given to RN  Post vital signs: Reviewed and stable  Last Vitals:  Vitals:   04/22/20 0818 04/22/20 1008  BP: 126/78 125/75  Pulse: 95 (!) 109  Resp: 18 11  Temp: 36.8 C 36.8 C  SpO2: [id number]    Complications: No apparent anesthesia complications

## 2020-04-23 NOTE — Op Note (Signed)
Barbara Oconnell, Barbara Oconnell MEDICAL RECORD [id number] ACCOUNT [id number] DATE OF BIRTH:03-03-1980 FACILITY: [facility] LOCATION: [facility] PHYSICIAN:Garlin Batdorf A. Salaam Battershell, MD  OPERATIVE REPORT  DATE OF PROCEDURE:  04/22/2020  PREOPERATIVE DIAGNOSES:   1.  Abnormal uterine bleeding.  2.  Endometrial mass.  PROCEDURES PERFORMED:   1.  Diagnostic hysteroscopy.  2.  Hysteroscopic resection of endometrial polyp.  3.  Dilation and curettage.  POSTOPERATIVE DIAGNOSES:   1.  Abnormal uterine bleeding.   2.  Endometrial mass.  ANESTHESIA:  General.  SURGEON:  Servando Salina, MD  ASSISTANT:  None.  DESCRIPTION OF PROCEDURE:  Under adequate general anesthesia, the patient was placed in the dorsal lithotomy position.  She was sterilely prepped and draped in the usual fashion.  The bladder was catheterized for a moderate amount of urine.  Examination  under anesthesia revealed an everted slightly enlarged uterus.  No adnexal masses could be appreciated.  Bivalve speculum was placed in the vagina.  Single-tooth tenaculum was placed on the anterior lip of the cervix.  The cervix easily accepted a #19  Pratt.  A diagnostic hysteroscope was introduced into the uterine cavity.  Clotted blood was noted and murky bloody fluid in the cavity.  At that point, the hysteroscope was removed.  A polyp forceps was then used to grasp some of the clotted blood from  the uterine cavity.  The hysteroscope was then reinserted.  It was still murky with a mass that was unclear as to whether or not there was an infarcted endometrial polyp.  Decision was then made to use the Lite resectoscope with its tip in view, the cavity content and masses were removed.  That resulted in the endometrial cavity then being seen and the endometrial walls being resected as well.  Both tubal ostia could be then seen.  The endocervical canal was without any lesions.  At that  point, the procedure was felt to be complete.  All instruments  were then removed from the vagina.  SPECIMEN:  Labeled endometrial curettings with polyp were sent to pathology.  FLUID DEFICIT:  230 mL. EBL: minimal COMPLICATIONS:  None.  DISPOSITION:  The patient tolerated the procedure well and was transferred to the recovery room in stable condition.  VN/NUANCE  D:04/23/2020 T:04/23/2020 JOB:[id number]

## 2020-04-25 LAB — SURGICAL PATHOLOGY

## 2020-04-28 ENCOUNTER — Other Ambulatory Visit: Payer: Self-pay | Admitting: Obstetrics and Gynecology

## 2024-08-27 ENCOUNTER — Other Ambulatory Visit: Payer: Self-pay | Admitting: Medical Genetics

## 2024-10-21 ENCOUNTER — Other Ambulatory Visit: Payer: Self-pay | Admitting: Medical Genetics

## 2024-10-21 DIAGNOSIS — Z006 Encounter for examination for normal comparison and control in clinical research program: Secondary | ICD-10-CM

## 2024-12-18 LAB — GENECONNECT MOLECULAR SCREEN: Genetic Analysis Overall Interpretation: NEGATIVE
